# Patient Record
Sex: Female | Born: 1935 | Race: White | Hispanic: No | Smoking: Former smoker
Health system: Southern US, Community
[De-identification: ages and names within clinical notes are randomized; demographics above are authoritative.]

## PROBLEM LIST (undated history)

## (undated) DIAGNOSIS — R011 Cardiac murmur, unspecified: Secondary | ICD-10-CM

## (undated) DIAGNOSIS — G8929 Other chronic pain: Secondary | ICD-10-CM

## (undated) DIAGNOSIS — I259 Chronic ischemic heart disease, unspecified: Secondary | ICD-10-CM

## (undated) DIAGNOSIS — I209 Angina pectoris, unspecified: Secondary | ICD-10-CM

## (undated) DIAGNOSIS — M549 Dorsalgia, unspecified: Secondary | ICD-10-CM

## (undated) DIAGNOSIS — D509 Iron deficiency anemia, unspecified: Secondary | ICD-10-CM

## (undated) DIAGNOSIS — I10 Essential (primary) hypertension: Secondary | ICD-10-CM

## (undated) DIAGNOSIS — E039 Hypothyroidism, unspecified: Secondary | ICD-10-CM

## (undated) DIAGNOSIS — E785 Hyperlipidemia, unspecified: Secondary | ICD-10-CM

## (undated) HISTORY — DX: Hyperlipidemia, unspecified: E78.5

## (undated) HISTORY — DX: Chronic ischemic heart disease, unspecified: I25.9

## (undated) HISTORY — DX: Essential (primary) hypertension: I10

## (undated) HISTORY — DX: Cardiac murmur, unspecified: R01.1

## (undated) HISTORY — PX: APPENDECTOMY: SHX54

## (undated) HISTORY — DX: Dorsalgia, unspecified: M54.9

## (undated) HISTORY — DX: Angina pectoris, unspecified: I20.9

## (undated) HISTORY — DX: Hypothyroidism, unspecified: E03.9

## (undated) HISTORY — DX: Iron deficiency anemia, unspecified: D50.9

## (undated) HISTORY — DX: Other chronic pain: G89.29

---

## 1996-12-23 HISTORY — PX: CORONARY ANGIOPLASTY: SHX604

## 1997-12-04 ENCOUNTER — Other Ambulatory Visit: Admission: RE | Admit: 1997-12-04 | Discharge: 1997-12-04 | Payer: Self-pay | Admitting: Cardiology

## 1999-06-11 ENCOUNTER — Encounter: Admission: RE | Admit: 1999-06-11 | Discharge: 1999-09-09 | Payer: Self-pay | Admitting: Anesthesiology

## 1999-08-20 ENCOUNTER — Encounter: Payer: Self-pay | Admitting: Neurosurgery

## 1999-08-20 ENCOUNTER — Ambulatory Visit (HOSPITAL_COMMUNITY): Admission: RE | Admit: 1999-08-20 | Discharge: 1999-08-20 | Payer: Self-pay | Admitting: Neurosurgery

## 2002-03-17 ENCOUNTER — Encounter: Payer: Self-pay | Admitting: Emergency Medicine

## 2002-03-17 ENCOUNTER — Inpatient Hospital Stay (HOSPITAL_COMMUNITY): Admission: EM | Admit: 2002-03-17 | Discharge: 2002-03-21 | Payer: Self-pay | Admitting: Emergency Medicine

## 2002-03-21 ENCOUNTER — Inpatient Hospital Stay (HOSPITAL_COMMUNITY)
Admission: RE | Admit: 2002-03-21 | Discharge: 2002-03-29 | Payer: Self-pay | Admitting: Physical Medicine & Rehabilitation

## 2002-03-28 ENCOUNTER — Encounter: Payer: Self-pay | Admitting: Physical Medicine & Rehabilitation

## 2002-07-30 ENCOUNTER — Encounter: Payer: Self-pay | Admitting: Neurosurgery

## 2002-07-30 ENCOUNTER — Ambulatory Visit (HOSPITAL_COMMUNITY): Admission: RE | Admit: 2002-07-30 | Discharge: 2002-07-30 | Payer: Self-pay | Admitting: Neurosurgery

## 2002-09-01 ENCOUNTER — Encounter: Admission: RE | Admit: 2002-09-01 | Discharge: 2002-09-01 | Payer: Self-pay | Admitting: Neurosurgery

## 2002-09-01 ENCOUNTER — Encounter: Payer: Self-pay | Admitting: Neurosurgery

## 2002-09-15 ENCOUNTER — Encounter: Admission: RE | Admit: 2002-09-15 | Discharge: 2002-09-15 | Payer: Self-pay | Admitting: Neurosurgery

## 2002-09-15 ENCOUNTER — Encounter: Payer: Self-pay | Admitting: Neurosurgery

## 2002-09-30 ENCOUNTER — Encounter: Payer: Self-pay | Admitting: Neurosurgery

## 2002-09-30 ENCOUNTER — Encounter: Admission: RE | Admit: 2002-09-30 | Discharge: 2002-09-30 | Payer: Self-pay | Admitting: Neurosurgery

## 2004-03-15 ENCOUNTER — Encounter: Admission: RE | Admit: 2004-03-15 | Discharge: 2004-03-15 | Payer: Self-pay | Admitting: Orthopaedic Surgery

## 2004-04-01 ENCOUNTER — Encounter: Admission: RE | Admit: 2004-04-01 | Discharge: 2004-04-01 | Payer: Self-pay | Admitting: Orthopaedic Surgery

## 2004-04-19 ENCOUNTER — Encounter: Admission: RE | Admit: 2004-04-19 | Discharge: 2004-04-19 | Payer: Self-pay | Admitting: Orthopaedic Surgery

## 2005-08-29 ENCOUNTER — Encounter: Admission: RE | Admit: 2005-08-29 | Discharge: 2005-08-29 | Payer: Self-pay | Admitting: Cardiology

## 2006-08-21 HISTORY — PX: TRANSTHORACIC ECHOCARDIOGRAM: SHX275

## 2007-03-26 ENCOUNTER — Encounter: Admission: RE | Admit: 2007-03-26 | Discharge: 2007-03-26 | Payer: Self-pay | Admitting: Anesthesiology

## 2009-05-14 ENCOUNTER — Encounter: Admission: RE | Admit: 2009-05-14 | Discharge: 2009-05-14 | Payer: Self-pay | Admitting: Neurosurgery

## 2009-05-28 ENCOUNTER — Encounter: Admission: RE | Admit: 2009-05-28 | Discharge: 2009-05-28 | Payer: Self-pay | Admitting: Neurosurgery

## 2009-12-19 DEATH — deceased

## 2010-02-20 ENCOUNTER — Ambulatory Visit: Payer: Self-pay | Admitting: Cardiology

## 2010-04-08 ENCOUNTER — Encounter: Payer: Self-pay | Admitting: Internal Medicine

## 2010-04-11 ENCOUNTER — Ambulatory Visit: Payer: Self-pay | Admitting: Internal Medicine

## 2010-04-11 DIAGNOSIS — M5137 Other intervertebral disc degeneration, lumbosacral region: Secondary | ICD-10-CM

## 2010-04-11 DIAGNOSIS — M549 Dorsalgia, unspecified: Secondary | ICD-10-CM | POA: Insufficient documentation

## 2010-04-11 DIAGNOSIS — R5383 Other fatigue: Secondary | ICD-10-CM

## 2010-04-11 DIAGNOSIS — I2510 Atherosclerotic heart disease of native coronary artery without angina pectoris: Secondary | ICD-10-CM | POA: Insufficient documentation

## 2010-04-11 DIAGNOSIS — E119 Type 2 diabetes mellitus without complications: Secondary | ICD-10-CM

## 2010-04-11 DIAGNOSIS — Z853 Personal history of malignant neoplasm of breast: Secondary | ICD-10-CM

## 2010-04-11 DIAGNOSIS — M19019 Primary osteoarthritis, unspecified shoulder: Secondary | ICD-10-CM | POA: Insufficient documentation

## 2010-04-11 DIAGNOSIS — F411 Generalized anxiety disorder: Secondary | ICD-10-CM | POA: Insufficient documentation

## 2010-04-11 DIAGNOSIS — M48061 Spinal stenosis, lumbar region without neurogenic claudication: Secondary | ICD-10-CM | POA: Insufficient documentation

## 2010-04-11 DIAGNOSIS — F068 Other specified mental disorders due to known physiological condition: Secondary | ICD-10-CM

## 2010-04-11 DIAGNOSIS — D649 Anemia, unspecified: Secondary | ICD-10-CM

## 2010-04-11 DIAGNOSIS — R5381 Other malaise: Secondary | ICD-10-CM

## 2010-04-11 DIAGNOSIS — N959 Unspecified menopausal and perimenopausal disorder: Secondary | ICD-10-CM | POA: Insufficient documentation

## 2010-04-11 DIAGNOSIS — R413 Other amnesia: Secondary | ICD-10-CM

## 2010-04-11 DIAGNOSIS — I10 Essential (primary) hypertension: Secondary | ICD-10-CM | POA: Insufficient documentation

## 2010-04-11 DIAGNOSIS — F3289 Other specified depressive episodes: Secondary | ICD-10-CM | POA: Insufficient documentation

## 2010-04-11 DIAGNOSIS — E785 Hyperlipidemia, unspecified: Secondary | ICD-10-CM

## 2010-04-11 DIAGNOSIS — F329 Major depressive disorder, single episode, unspecified: Secondary | ICD-10-CM

## 2010-04-11 DIAGNOSIS — E559 Vitamin D deficiency, unspecified: Secondary | ICD-10-CM | POA: Insufficient documentation

## 2010-04-11 DIAGNOSIS — E039 Hypothyroidism, unspecified: Secondary | ICD-10-CM | POA: Insufficient documentation

## 2010-04-11 DIAGNOSIS — I252 Old myocardial infarction: Secondary | ICD-10-CM

## 2010-04-18 ENCOUNTER — Encounter: Admission: RE | Admit: 2010-04-18 | Discharge: 2010-04-18 | Payer: Self-pay | Admitting: Internal Medicine

## 2010-07-01 ENCOUNTER — Ambulatory Visit: Payer: Self-pay | Admitting: Cardiology

## 2010-07-01 ENCOUNTER — Encounter: Payer: Self-pay | Admitting: Internal Medicine

## 2010-07-10 ENCOUNTER — Ambulatory Visit: Payer: Self-pay | Admitting: Internal Medicine

## 2010-08-18 LAB — CONVERTED CEMR LAB
Albumin: 4.1 g/dL (ref 3.5–5.2)
Alkaline Phosphatase: 42 units/L (ref 39–117)
BUN: 27 mg/dL — ABNORMAL HIGH (ref 6–23)
Basophils Relative: 0.2 % (ref 0.0–3.0)
Bilirubin Urine: NEGATIVE
CO2: 27 meq/L (ref 19–32)
Chloride: 102 meq/L (ref 96–112)
Cholesterol: 159 mg/dL (ref 0–200)
Creatinine, Ser: 1.2 mg/dL (ref 0.4–1.2)
Eosinophils Relative: 1 % (ref 0.0–5.0)
Folate: >20 ng/mL
Glucose, Bld: 138 mg/dL — ABNORMAL HIGH (ref 70–99)
Hemoglobin, Urine: NEGATIVE
Hemoglobin: 10.5 g/dL — ABNORMAL LOW (ref 12.0–15.0)
Iron: 107 ug/dL (ref 42–145)
Ketones, ur: NEGATIVE mg/dL
Lymphocytes Relative: 22.2 % (ref 12.0–46.0)
MCV: 94.1 fL (ref 78.0–100.0)
Microalb Creat Ratio: 1 mg/g (ref 0.0–30.0)
Microalb, Ur: 0.6 mg/dL (ref 0.0–1.9)
Neutro Abs: 5 10*3/uL (ref 1.4–7.7)
Neutrophils Relative %: 70.7 % (ref 43.0–77.0)
Platelets: 105 10*3/uL — ABNORMAL LOW (ref 150.0–400.0)
RBC: 3.27 M/uL — ABNORMAL LOW (ref 3.87–5.11)
Sed Rate: 17 mm/hr (ref 0–22)
Total Protein, Urine: NEGATIVE mg/dL
Transferrin: 240.3 mg/dL (ref 212.0–360.0)
Urine Glucose: NEGATIVE mg/dL
Urobilinogen, UA: 0.2 (ref 0.0–1.0)
Vit D, 25-Hydroxy: 46 ng/mL (ref 30–89)

## 2010-08-22 ENCOUNTER — Telehealth: Payer: Self-pay | Admitting: Internal Medicine

## 2010-08-22 NOTE — Letter (Signed)
Summary: Date Range: 12-05-96 to 04-08-10/[facility]  Date Range: 12-05-96 to 04-08-10/[facility]   Imported By: Sherian Rein 04/15/2010 08:14:56  _____________________________________________________________________  External Attachment:    Type:   Image     Comment:   External Document

## 2010-08-22 NOTE — Miscellaneous (Signed)
Summary: BONE DENSITY  Clinical Lists Changes  Orders: Added new Test order of T-Lumbar Vertebral Assessment (77082) - Signed 

## 2010-08-22 NOTE — Assessment & Plan Note (Signed)
Summary: NEW / MEDICARE / #/ CD   Vital Signs:  Patient profile:   77 year old female Height:      65 inches Weight:      152.38 pounds BMI:     25.45 O2 Sat:      97 % on Room air Temp:     98.4 degrees F oral Pulse rate:   55 / minute BP sitting:   120 / 62  (left arm) Cuff size:   regular  Vitals Entered By: Zella Ball Ewing CMA Duncan Dull) (April 11, 2010 10:31 AM)  O2 Flow:  Room air  Preventive Care Screening     declines colonoscopy  CC: new Patient, New medicare/RE/wellness   CC:  new Patient and New medicare/RE/wellness.  History of Present Illness: here as new pt to establish with wellness and acute;  hx somewhat difficult and she states she recognizes she has had singificant worsening problem with ST memory gradually for the past yr, though she feels she can take her meds approp and understands what they are for and how to take, though not strengths.  A nephew lives with her, and she c/o being taken advantage of when he brings friends over she does not approve above, no apparent abuse, though she c/o significant stress and depressive symptoms related to this in the past few months (though she delcines meds or counseling due to her mult current meds);  denies suicidal ideation, or panic. Has ongoing fatigue but denies hypersmonia. Had incidnental myalgias and watery diarrea for 2 days earlier this wk now resolved without fever, ST, cough, dysuria and Pt denies CP, worsening sob, doe, wheezing, orthopnea, pnd, worsening LE edema, palps, dizziness or syncope  Pt denies new neuro symptoms such as headache, facial or extremity weakness  No fever, wt loss, night sweats, loss of appetite or other constitutional symptoms  Pt denies polydipsia, polyuria, or low sugar symptoms such as shakiness improved with eating.  Overall good compliance with meds, trying to follow low chol, DM diet, wt stable, little excercise however  Denies worsening hyper or hypothyroid symptoms, did have synthorid  adjusted early aug per cardiology, now due for f/u TSH.   Here for wellness Diet: Heart Healthy or DM if diabetic Physical Activities: Sedentary Depression/mood screen: yes with mult social stressors  Hearing: Intact bilateral Visual Acuity: Grossly normal, gets exam yearly ADL's: Capable , has rolling walker  Fall Risk: mild to moderate Home Safety: Good Cognitive Impairment:  Gen appearance, speech, attention & motor skills grossly intact except for depressed affect and mild cognitive dysfunction End-of-Life Planning: Advance directive - Full code/I agree   Preventive Screening-Counseling & Management  Alcohol-Tobacco     Smoking Status: quit      Drug Use:  no.    Problems Prior to Update: 1)  Unspecified Vitamin D Deficiency  (ICD-268.9) 2)  Menopausal Disorder  (ICD-627.9) 3)  Memory Loss  (ICD-780.93) 4)  Fatigue  (ICD-780.79) 5)  Depression  (ICD-311) 6)  Dementia  (ICD-294.8) 7)  Hypothyroidism  (ICD-244.9) 8)  Back Pain, Chronic  (ICD-724.5) 9)  Anxiety  (ICD-300.00) 10)  Osteoarthritis, Shoulder, Right  (ICD-715.91) 11)  Spinal Stenosis, Lumbar  (ICD-724.02) 12)  Disc Disease, Lumbar  (ICD-722.52) 13)  Breast Cancer, Hx of  (ICD-V10.3) 14)  Hyperlipidemia  (ICD-272.4) 15)  Anemia-nos  (ICD-285.9) 16)  Myocardial Infarction, Hx of  (ICD-412) 17)  Coronary Artery Disease  (ICD-414.00) 18)  Hypertension  (ICD-401.9) 19)  Diabetes Mellitus, Type II  (ICD-250.00)  Medications  Prior to Update: 1)  None  Current Medications (verified): 1)  Isosorbide Mononitrate Cr 120 Mg Xr24h-Tab (Isosorbide Mononitrate) .Marland Kitchen.. 1po Once Daily 2)  Metformin Hcl 500 Mg Tabs (Metformin Hcl) .... 2 By Mouth Once Daily 3)  Nitrostat 0.3 Mg Subl (Nitroglycerin) .... Use Asd 4)  Lipitor 80 Mg Tabs (Atorvastatin Calcium) .Marland Kitchen.. 1po Once Daily 5)  Toprol Xl 100 Mg Xr24h-Tab (Metoprolol Succinate) .Marland Kitchen.. 1 By Mouth Once Daily 6)  Avapro 300 Mg Tabs (Irbesartan) .Marland Kitchen.. 1po Once Daily 7)  Ecotrin  325 Mg Tbec (Aspirin) .Marland Kitchen.. 1po Once Daily 8)  Alprazolam 1 Mg Tabs (Alprazolam) .Marland Kitchen.. 1po At Bedtime 9)  Oxycontin 40 Mg Xr12h-Tab (Oxycodone Hcl) .Marland Kitchen.. 1 By Mouth Two Times A Day  - Dr Channing Mutters 10)  Iron 325 (65 Fe) Mg Tabs (Ferrous Sulfate) .Marland Kitchen.. 1po Once Daily 11)  Furosemide 40 Mg Tabs (Furosemide) .Marland Kitchen.. 1po Once Daily 12)  Levothyroxine Sodium 50 Mcg Tabs (Levothyroxine Sodium) .Marland Kitchen.. 1po Once Daily 13)  Aleve 220 Mg Tabs (Naproxen Sodium) .Marland Kitchen.. 1po Two Times A Day As Needed 14)  Glimepiride 4 Mg Tabs (Glimepiride) .Marland Kitchen.. 1 By Mouth Once Daily 15)  Mycolog Cream .... Asd As Needed 16)  Mvi .Marland Kitchen.. 1 By Mouth Once Daily 17)  Folic Acid 1 Mg Tabs (Folic Acid) .Marland Kitchen.. 1po Once Daily 18)  Metoprolol Tartrate 50 Mg Tabs (Metoprolol Tartrate) .Marland Kitchen.. 1 By Mouth Two Times A Day 19)  Carisoprodol 350 Mg Tabs (Carisoprodol) 20)  Oxycodone-Acetaminophen 10-325 Mg Tabs (Oxycodone-Acetaminophen) .Marland Kitchen.. 1 By Mouth Q 6 Hrs As Needed - Dr Channing Mutters 21)  Onetouch Ultra Blue  Strp (Glucose Blood) .... 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 (pt unclear on details) - occurred early 1980's right shoulder djd Anxiety chronic pain syndrome Hypothyroidism Dementia Depression  Past Surgical History: s/p right hip fx - 2003 - Dr Cleophas Dunker Appendectomy s/p back surgury s/p right breast mastectomy  Family History: Reviewed history and no changes required. vascular disease  father died with MI nephew x 2 - heart disease brother with cancer  mother died at childbirth sister died with cancer  Social History: Reviewed history and no changes required. 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  The patient denies anorexia, fever, vision loss, decreased hearing, hoarseness, chest pain, syncope, dyspnea on exertion, peripheral edema, prolonged cough, headaches, hemoptysis, abdominal pain, melena, hematochezia, severe indigestion/heartburn, hematuria, muscle weakness, suspicious skin lesions, transient blindness, difficulty walking, unusual weight change, abnormal bleeding, enlarged lymph nodes, and angioedema.         all otherwise negative per pt -  except for ongoing fatigue without hypersomnia  Physical Exam  General:  alert and underweight appearing.  , uncomfortable with her chronic pain Head:  normocephalic and atraumatic.   Eyes:  vision grossly intact, pupils equal, and pupils round.   Ears:  R ear normal and L ear normal.   Nose:  no external deformity and no nasal discharge.   Mouth:  no gingival abnormalities and pharynx pink and moist.   Neck:  supple and no masses.   Lungs:  normal respiratory effort and normal breath sounds.   Heart:  normal rate and regular rhythm.   Abdomen:  soft, non-tender, and normal bowel sounds.   Msk:  no  peripheral joint tenderness and no joint swelling.   Extremities:  no edema, no erythema  Neurologic:  cranial nerves II-XII intact and strength normal in all extremities., walks with walker; cognitive intact to orientation, naming, and repetition except mild problem with recall Skin:  color normal and no rashes.   Psych:  dysphoric affect and moderately anxious.     Impression & Recommendations:  Problem # 1:  Preventive Health Care (ICD-V70.0)  Overall doing well, age appropriate education and counseling updated and referral for appropriate preventive services done unless declined, immunizations up to date or declined, diet counseling done if overweight, urged to quit smoking if smokes , most recent labs reviewed and current ordered if appropriate, ecg reviewed or declined (interpretation per ECG scanned in the EMR if done); information regarding Medicare Prevention requirements given if appropriate; speciality referrals updated as appropriate ; had ECG with cardiology recently  Orders: Medicare -1st Annual Wellness Visit [phone])  Problem # 2:  DEMENTIA (ICD-294.8) new onset in the past yr;  for head mri, routine labs and neurology referral  Problem # 3:  DIABETES MELLITUS, TYPE II (ICD-250.00)  Her updated medication list for this problem includes:    Metformin Hcl 500 Mg Tabs (Metformin hcl) .Marland Kitchen... 2 by mouth once daily    Avapro 300 Mg Tabs (Irbesartan) .Marland Kitchen... 1po once daily    Ecotrin 325 Mg Tbec (Aspirin) .Marland Kitchen... 1po once daily    Glimepiride 4 Mg Tabs (Glimepiride) .Marland Kitchen... 1 by mouth once daily  Orders: TLB-Microalbumin/Creat Ratio, Urine (82043-MALB) TLB-A1C / Hgb A1C (Glycohemoglobin) (83036-A1C)  Problem # 4:  HYPERTENSION (ICD-401.9)  Her updated medication list for this problem includes:    Toprol Xl 100 Mg Xr24h-tab (Metoprolol succinate) .Marland Kitchen... 1 by mouth once daily    Avapro 300 Mg Tabs (Irbesartan) .Marland Kitchen... 1po once daily    Furosemide 40 Mg Tabs (Furosemide) .Marland Kitchen... 1po once  daily    Metoprolol Tartrate 50 Mg Tabs (Metoprolol tartrate) .Marland Kitchen... 1 by mouth two times a day  Orders: TLB-Udip ONLY (81003-UDIP)  Problem # 5:  HYPOTHYROIDISM (ICD-244.9)  Her updated medication list for this problem includes:    Levothyroxine Sodium 50 Mcg Tabs (Levothyroxine sodium) .Marland Kitchen... 1po once daily stable overall by hx and exam, ok to  continue meds/tx as is ;  to check TSH on new thyroid med strength  Problem # 6:  HYPERLIPIDEMIA (ICD-272.4)  Her updated medication list for this problem includes:    Lipitor 80 Mg Tabs (Atorvastatin calcium) .Marland Kitchen... 1po once daily  Orders: TLB-Lipid Panel (80061-LIPID) stable overall by hx and exam, ok to continue meds/tx as is , Pt to continue diet efforts, good med tolerance; to check labs - goal LDL less than 70   Problem # 7:  ANEMIA-NOS (ICD-285.9)  Her updated medication list for this problem includes:    Iron 325 (65 Fe) Mg Tabs (Ferrous sulfate) .Marland Kitchen... 1po once daily    Folic Acid 1 Mg Tabs (Folic acid) .Marland Kitchen... 1po once daily  Orders: TLB-IBC Pnl (Iron/FE;Transferrin) (83550-IBC) TLB-B12 + Folate Pnl (82746_82607-B12/FOL) to re-check hgb and above, o/w stable overall by hx and exam, ok to continue meds/tx as is   Problem # 8:  DEPRESSION (ICD-311)  Her updated medication list for this problem includes:    Alprazolam 1 Mg Tabs (Alprazolam) .Marland Kitchen... 1po at bedtime pt delcines tx at this time or counseling  Problem # 9:  FATIGUE (ICD-780.79)  exam benign except for mult co-morbids today, to check labs below; follow with expectant management   Orders: TLB-BMP (Basic Metabolic Panel-BMET) (80048-METABOL) TLB-CBC Platelet - w/Differential (85025-CBCD) TLB-Hepatic/Liver Function Pnl (80076-HEPATIC) TLB-TSH (Thyroid Stimulating Hormone) (84443-TSH) TLB-Sedimentation Rate (ESR) (85652-ESR)  Complete Medication List: 1)  Isosorbide Mononitrate Cr 120 Mg Xr24h-tab (Isosorbide mononitrate) .Marland Kitchen.. 1po once daily 2)  Metformin Hcl 500 Mg  Tabs (Metformin hcl) .... 2 by mouth once daily 3)  Nitrostat 0.3 Mg Subl (Nitroglycerin) .... Use asd 4)  Lipitor 80 Mg Tabs (Atorvastatin calcium) .Marland Kitchen.. 1po once daily 5)  Toprol Xl 100 Mg Xr24h-tab (Metoprolol succinate) .Marland Kitchen.. 1 by mouth once daily 6)  Avapro 300 Mg Tabs (Irbesartan) .Marland Kitchen.. 1po once daily 7)  Ecotrin 325 Mg Tbec (Aspirin) .Marland Kitchen.. 1po once daily 8)  Alprazolam 1 Mg Tabs (Alprazolam) .Marland Kitchen.. 1po at bedtime 9)  Oxycontin 40 Mg Xr12h-tab (Oxycodone hcl) .Marland Kitchen.. 1 by mouth two times a day  - dr Channing Mutters 10)  Iron 325 (65 Fe) Mg Tabs (Ferrous sulfate) .Marland Kitchen.. 1po once daily 11)  Furosemide 40 Mg Tabs (Furosemide) .Marland Kitchen.. 1po once daily 12)  Levothyroxine Sodium 50 Mcg Tabs (Levothyroxine sodium) .Marland Kitchen.. 1po once daily 13)  Aleve 220 Mg Tabs (Naproxen sodium) .Marland Kitchen.. 1po two times a day as needed 14)  Glimepiride 4 Mg Tabs (Glimepiride) .Marland Kitchen.. 1 by mouth once daily 15)  Mycolog Cream  .... Asd as needed 16)  Mvi  .Marland Kitchen.. 1 by mouth once daily 17)  Folic Acid 1 Mg Tabs (Folic acid) .Marland Kitchen.. 1po once daily 18)  Metoprolol Tartrate 50 Mg Tabs (Metoprolol tartrate) .Marland Kitchen.. 1 by mouth two times a day 19)  Carisoprodol 350 Mg Tabs (Carisoprodol) 20)  Oxycodone-acetaminophen 10-325 Mg Tabs (Oxycodone-acetaminophen) .Marland Kitchen.. 1 by mouth q 6 hrs as needed - dr Channing Mutters 21)  Onetouch Ultra Blue Strp (Glucose blood) .... Use asd 1 once daily  Other Orders: T-Syphilis Test (RPR) [phone]) T-Bone Densitometry [phone]) T-Vitamin D (25-Hydroxy) [phone]) Radiology Referral (Radiology) Neurology Referral (Neuro) Flu Vaccine 4yrs + MEDICARE PATIENTS [phone]) Administration Flu vaccine - MCR (G0008) Tdap => 41yrs IM (38756) Pneumococcal Vaccine (43329) Admin 1st Vaccine (51884) Admin of Any Addtl Vaccine (16606)  Patient Instructions: 1)  please see Dr Patty Sermons in followup in Dec 2011 as you have planned 2)  you had the tetanus, flu, and pneumonia shots today 3)  please call for your yearly mammogram - consider  [facility]  on [address] 4)  Please schedule your Bone Density test before leaving today 5)  Please go to the Lab in the basement for your blood and/or urine tests today 6)  Please call the number on the [facility] Card for results of your testing  7)  You will be contacted about the referral(s) to: MRI head (for the memory loss) and Neurology consult 8)  Continue all previous medications as before this visit  - there are no  medication changes today 9)  Please schedule a follow-up appointment in 3 months.    Flu Vaccine Consent Questions     Do you have a history of severe allergic reactions to this vaccine? no    Any prior history of allergic reactions to egg and/or gelatin? no    Do you have a sensitivity to the preservative Thimersol? no    Do you have a past history of Guillan-Barre Syndrome? no    Do you currently have an acute febrile illness? no    Have you ever had a severe reaction to latex? no    Vaccine information given and explained to patient? yes    Are you currently pregnant? no    Lot Number:[id number]   Exp Date:01/18/2011   Site Given  Left Deltoid IMlu   Immunizations Administered:  Tetanus Vaccine:    Vaccine Type: Tdap    Site: right deltoid    Mfr: GlaxoSmithKline    Dose: 0.5 ml    Route: IM    Given by: Zella Ball Ewing CMA (AAMA)    Exp. Date: 05/09/2012    Lot #: [id number]    VIS given: 06/07/08 version given April 11, 2010.  Pneumonia Vaccine:    Vaccine Type: Pneumovax    Site: left buttock    Mfr: Merck    Dose: 0.5 ml    Route: IM    Given by: Zella Ball Ewing CMA (AAMA)    Exp. Date: 10/03/2011    Lot #: [id number]    VIS given: 06/25/09 version given April 11, 2010.

## 2010-08-27 ENCOUNTER — Ambulatory Visit (INDEPENDENT_AMBULATORY_CARE_PROVIDER_SITE_OTHER): Payer: Medicare Other | Admitting: Internal Medicine

## 2010-08-27 ENCOUNTER — Encounter: Payer: Self-pay | Admitting: Internal Medicine

## 2010-08-27 ENCOUNTER — Encounter (INDEPENDENT_AMBULATORY_CARE_PROVIDER_SITE_OTHER): Payer: Self-pay | Admitting: *Deleted

## 2010-08-27 ENCOUNTER — Other Ambulatory Visit: Payer: Self-pay | Admitting: Internal Medicine

## 2010-08-27 ENCOUNTER — Other Ambulatory Visit: Payer: Medicare Other

## 2010-08-27 DIAGNOSIS — E119 Type 2 diabetes mellitus without complications: Secondary | ICD-10-CM

## 2010-08-27 DIAGNOSIS — E785 Hyperlipidemia, unspecified: Secondary | ICD-10-CM

## 2010-08-27 DIAGNOSIS — M810 Age-related osteoporosis without current pathological fracture: Secondary | ICD-10-CM

## 2010-08-27 DIAGNOSIS — I10 Essential (primary) hypertension: Secondary | ICD-10-CM

## 2010-08-27 LAB — LIPID PANEL
HDL: 35.1 mg/dL — ABNORMAL LOW (ref >39.00)
Total CHOL/HDL Ratio: 4
Triglycerides: 269 mg/dL — ABNORMAL HIGH (ref 0.0–149.0)

## 2010-08-27 LAB — HEMOGLOBIN A1C: Hgb A1c MFr Bld: 7.1 % — ABNORMAL HIGH (ref 4.6–6.5)

## 2010-08-27 LAB — BASIC METABOLIC PANEL
Calcium: 8.8 mg/dL (ref 8.4–10.5)
GFR: 34.77 mL/min — ABNORMAL LOW (ref >60.00)
Sodium: 140 mEq/L (ref 135–145)

## 2010-08-28 NOTE — Progress Notes (Signed)
Summary: Rx ?  Phone Note Call from Patient   Summary of Call: Pt left vm req non-narcotic rx? Req call back.  Initial call taken by: Lamar Sprinkles, CMA,  August 22, 2010 11:24 AM  Follow-up for Phone Call        Pt called requesting refill of Oxycodone, Oxycontin and Levothyroxine. Pt advised to make ROV to refill narc Follow-up by: Margaret Pyle, CMA,  August 22, 2010 4:25 PM    Prescriptions: LEVOTHYROXINE SODIUM 50 MCG TABS (LEVOTHYROXINE SODIUM) 1po once daily  #90 x 1   Entered by:   Margaret Pyle, CMA   Authorized by:   Corwin Levins MD   Signed by:   Margaret Pyle, CMA on 08/22/2010   Method used:   Electronically to        [facility] [phone]* (retail)       [address]       [place], [place]  [address]       Ph: [phone] or [phone]       Fax: [phone]   RxID:   [id number]

## 2010-08-29 ENCOUNTER — Telehealth: Payer: Self-pay | Admitting: Internal Medicine

## 2010-09-05 NOTE — Assessment & Plan Note (Signed)
Summary: FU ON MEDS /NWS   Vital Signs:  Patient profile:   77 year old female Height:      63 inches Weight:      154.25 pounds BMI:     27.42 O2 Sat:      96 % on Room air Temp:     98.3 degrees F oral Pulse rate:   63 / minute BP sitting:   118 / 50  (left arm) Cuff size:   regular  Vitals Entered By: Zella Ball Ewing CMA (AAMA) (August 27, 2010 3:15 PM)  O2 Flow:  Room air CC: followup/RE   CC:  followup/RE.  History of Present Illness: here to f/fu - overall doing ok,  Pt denies CP, worsening sob, doe, wheezing, orthopnea, pnd, worsening LE edema, palps, dizziness or syncope  Pt denies new neuro symptoms such as headache, facial or extremity weakness Pt denies polydipsia, polyuria, or low sugar symptoms such as shakiness improved with eating.  Overall good compliance with meds, trying to follow low chol, DM diet, wt stable, little excercise however  Not really checking cbg's at home. Denies worsening depressive symptoms, suicidal ideation, or panic, though has ongoing anxiety and needs med refills.  Overall pain continues but controlled on current meds, needs refill.  Overall good compliance with meds, and good tolerability.  No fever, wt loss, night sweats, loss of appetite or other constitutional symptoms  Could not tolerate the alendronate  - seemed to make her chronic pain worse;  not gettin the oxycontin per DR Channing Mutters anymore as he has moved 45 min out of town and she cant go that far;  same for xanax.    Problems Prior to Update: 1)  Osteoporosis  (ICD-733.00) 2)  Preventive Health Care  (ICD-V70.0) 3)  Unspecified Vitamin D Deficiency  (ICD-268.9) 4)  Menopausal Disorder  (ICD-627.9) 5)  Memory Loss  (ICD-780.93) 6)  Fatigue  (ICD-780.79) 7)  Depression  (ICD-311) 8)  Dementia  (ICD-294.8) 9)  Hypothyroidism  (ICD-244.9) 10)  Back Pain, Chronic  (ICD-724.5) 11)  Anxiety  (ICD-300.00) 12)  Osteoarthritis, Shoulder, Right  (ICD-715.91) 13)  Spinal Stenosis, Lumbar   (ICD-724.02) 14)  Disc Disease, Lumbar  (ICD-722.52) 15)  Breast Cancer, Hx of  (ICD-V10.3) 16)  Hyperlipidemia  (ICD-272.4) 17)  Anemia-nos  (ICD-285.9) 18)  Myocardial Infarction, Hx of  (ICD-412) 19)  Coronary Artery Disease  (ICD-414.00) 20)  Hypertension  (ICD-401.9) 21)  Diabetes Mellitus, Type II  (ICD-250.00)  Medications Prior to Update: 1)  Isosorbide Mononitrate Cr 120 Mg Xr24h-Tab (Isosorbide Mononitrate) .Marland Kitchen.. 1po Once Daily 2)  Metformin Hcl 500 Mg Tabs (Metformin Hcl) .... 2 By Mouth in The Am, and 1 By Mouth in The Pm 3)  Nitrostat 0.3 Mg Subl (Nitroglycerin) .... Use Asd 4)  Lipitor 80 Mg Tabs (Atorvastatin Calcium) .Marland Kitchen.. 1po Once Daily 5)  Toprol Xl 100 Mg Xr24h-Tab (Metoprolol Succinate) .Marland Kitchen.. 1 By Mouth Once Daily 6)  Avapro 300 Mg Tabs (Irbesartan) .Marland Kitchen.. 1po Once Daily 7)  Ecotrin 325 Mg Tbec (Aspirin) .Marland Kitchen.. 1po Once Daily 8)  Alprazolam 1 Mg Tabs (Alprazolam) .Marland Kitchen.. 1po At Bedtime 9)  Oxycontin 40 Mg Xr12h-Tab (Oxycodone Hcl) .Marland Kitchen.. 1 By Mouth Two Times A Day  - Dr Channing Mutters 10)  Iron 325 (65 Fe) Mg Tabs (Ferrous Sulfate) .Marland Kitchen.. 1po Once Daily 11)  Furosemide 40 Mg Tabs (Furosemide) .Marland Kitchen.. 1po Once Daily 12)  Levothyroxine Sodium 50 Mcg Tabs (Levothyroxine Sodium) .Marland Kitchen.. 1po Once Daily 13)  Aleve 220 Mg Tabs (Naproxen  Sodium) .Marland Kitchen.. 1po Two Times A Day As Needed 14)  Glimepiride 4 Mg Tabs (Glimepiride) .Marland Kitchen.. 1 By Mouth Once Daily 15)  Mycolog Cream .... Asd As Needed 16)  Mvi .Marland Kitchen.. 1 By Mouth Once Daily 17)  Folic Acid 1 Mg Tabs (Folic Acid) .Marland Kitchen.. 1po Once Daily 18)  Metoprolol Tartrate 50 Mg Tabs (Metoprolol Tartrate) .Marland Kitchen.. 1 By Mouth Two Times A Day 19)  Carisoprodol 350 Mg Tabs (Carisoprodol) 20)  Oxycodone-Acetaminophen 10-325 Mg Tabs (Oxycodone-Acetaminophen) .Marland Kitchen.. 1 By Mouth Q 6 Hrs As Needed - Dr Channing Mutters 21)  Onetouch Ultra Blue  Strp (Glucose Blood) .... Use Asd 1 Once Daily 22)  Alendronate Sodium 70 Mg Tabs (Alendronate Sodium) .Marland Kitchen.. 1po Q Wk  Current Medications (verified): 1)   Isosorbide Mononitrate Cr 120 Mg Xr24h-Tab (Isosorbide Mononitrate) .Marland Kitchen.. 1po Once Daily 2)  Metformin Hcl 500 Mg Tabs (Metformin Hcl) .... 2 By Mouth in The Am, and 1 By Mouth in The Pm 3)  Nitrostat 0.3 Mg Subl (Nitroglycerin) .... Use Asd 4)  Lipitor 80 Mg Tabs (Atorvastatin Calcium) .Marland Kitchen.. 1po Once Daily 5)  Toprol Xl 100 Mg Xr24h-Tab (Metoprolol Succinate) .Marland Kitchen.. 1 By Mouth Once Daily 6)  Avapro 300 Mg Tabs (Irbesartan) .Marland Kitchen.. 1po Once Daily 7)  Ecotrin 325 Mg Tbec (Aspirin) .Marland Kitchen.. 1po Once Daily 8)  Alprazolam 1 Mg Tabs (Alprazolam) .Marland Kitchen.. 1po At Bedtime - To Fill Aug 27, 2010 9)  Oxycontin 40 Mg Xr12h-Tab (Oxycodone Hcl) .Marland Kitchen.. 1 By Mouth Two Times A Day  - To Fill Sep 23, 2010 10)  Iron 325 (65 Fe) Mg Tabs (Ferrous Sulfate) .Marland Kitchen.. 1po Once Daily 11)  Furosemide 40 Mg Tabs (Furosemide) .Marland Kitchen.. 1po Once Daily 12)  Levothyroxine Sodium 50 Mcg Tabs (Levothyroxine Sodium) .Marland Kitchen.. 1po Once Daily 13)  Aleve 220 Mg Tabs (Naproxen Sodium) .Marland Kitchen.. 1po Two Times A Day As Needed 14)  Glimepiride 4 Mg Tabs (Glimepiride) .Marland Kitchen.. 1 By Mouth Once Daily 15)  Mycolog Cream .... Asd As Needed 16)  Mvi .Marland Kitchen.. 1 By Mouth Once Daily 17)  Folic Acid 1 Mg Tabs (Folic Acid) .Marland Kitchen.. 1po Once Daily 18)  Metoprolol Tartrate 50 Mg Tabs (Metoprolol Tartrate) .Marland Kitchen.. 1 By Mouth Two Times A Day 19)  Carisoprodol 350 Mg Tabs (Carisoprodol) 20)  Oxycodone-Acetaminophen 10-325 Mg Tabs (Oxycodone-Acetaminophen) .Marland Kitchen.. 1 By Mouth Q 6 Hrs As Needed - To Fill Aug 27, 2010 21)  Onetouch Ultra Blue  Strp (Glucose Blood) .... 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[employer] widow 1 child  - son died  - cancer, 21's 1 grandson - lives with her, also with  sister and brother in [place]  Risk Factors: Smoking Status: quit (04/11/2010)  Past Medical History: MD roster:  card - Dr Patty Sermons                   ortho - Dr Cleophas Dunker                     neurosurgury - Dr Channing Mutters                    optho - Dr Dione Booze Diabetes mellitus, type II Hypertension Coronary artery disease Myocardial infarction, hx of Anemia-NOS with  transfusion 2u PRBC with hip surgury 2003 Hyperlipidemia DDD/lumbar disc disease - severe with spinal stenosis Breast cancer, hx of - ? (pt unclear on details) - occurred early 1980's right shoulder djd Anxiety chronic pain syndrome Hypothyroidism Dementia Depression Osteoporosis  Review of Systems       all otherwise negative per pt -    Physical Exam  General:  alert and underweight appearing.  , uncomfortable with her chronic pain Head:  normocephalic and atraumatic.   Eyes:  vision grossly intact, pupils equal, and pupils round.   Ears:  R ear normal and L ear normal.   Nose:  no external deformity and no nasal discharge.   Mouth:  no gingival abnormalities and pharynx pink and moist.   Neck:  supple and no masses.   Lungs:  normal respiratory effort and normal breath sounds.   Heart:  normal rate and regular rhythm.   Msk:  no peripheral joint tenderness and no joint swelling.   Extremities:  no edema, no erythema  Neurologic:  cranial nerves II-XII intact and strength normal in all extremities., walks with walker;   Impression & Recommendations:  Problem # 1:  DIABETES MELLITUS, TYPE II (ICD-250.00)  Her updated medication list for this problem includes:    Metformin Hcl 500 Mg Tabs (Metformin hcl) .Marland Kitchen... 2 by mouth in the am, and 1 by mouth in the pm    Avapro 300 Mg Tabs (Irbesartan) .Marland Kitchen... 1po once daily    Ecotrin 325 Mg Tbec (Aspirin) .Marland Kitchen... 1po once daily    Glimepiride 4 Mg Tabs (Glimepiride) .Marland Kitchen... 1 by mouth once daily  Labs Reviewed: Creat: 1.2 (04/11/2010)    Reviewed HgBA1c results: 7.7  (04/11/2010) stable overall by hx and exam, ok to continue meds/tx as is , Pt to cont DM diet, excercise, wt control efforts; to check labs today   Orders: TLB-BMP (Basic Metabolic Panel-BMET) (80048-METABOL) TLB-Lipid Panel (80061-LIPID) TLB-A1C / Hgb A1C (Glycohemoglobin) (83036-A1C)  Problem # 2:  HYPERTENSION (ICD-401.9)  Her updated medication list for this problem includes:    Toprol Xl 100 Mg Xr24h-tab (Metoprolol succinate) .Marland Kitchen... 1 by mouth once daily    Avapro 300 Mg Tabs (Irbesartan) .Marland Kitchen... 1po once daily    Furosemide 40 Mg Tabs (Furosemide) .Marland Kitchen... 1po once daily    Metoprolol Tartrate 50 Mg Tabs (Metoprolol tartrate) .Marland Kitchen... 1 by mouth two times a day  BP today: 118/50 Prior BP: 120/62 (04/11/2010)  Labs Reviewed: K+: 4.8 (04/11/2010) Creat: : 1.2 (04/11/2010)   Chol: 159 (04/11/2010)   HDL: 48.00 (04/11/2010)   LDL: 82 (04/11/2010)   TG: 147.0 (04/11/2010) stable overall by hx and exam, ok to continue meds/tx as is   Problem # 3:  HYPERLIPIDEMIA (ICD-272.4)  Her updated medication list for this problem includes:    Lipitor 80 Mg Tabs (Atorvastatin calcium) .Marland Kitchen... 1po once daily  Labs Reviewed: SGOT: 29 (04/11/2010)   SGPT: 20 (04/11/2010)   HDL:48.00 (04/11/2010)  LDL:82 (04/11/2010)  Chol:159 (04/11/2010)  Trig:147.0 (04/11/2010) stable overall by hx and exam, ok to continue meds/tx as is  - Pt to continue diet efforts, good med tolerance; to check labs - goal LDL less than 70   Problem # 4:  OSTEOPOROSIS (ICD-733.00)  The following medications were removed from the medication list:    Alendronate Sodium 70 Mg Tabs (Alendronate sodium) .Marland Kitchen... 1po q wk could not tolerate  - ok to stop, will look into prolia for her as she has hx of hip fx  Discussed medication  use, applications of heat or ice, and exercises.   Problem # 5:  ANXIETY (ICD-300.00)  Her updated medication list for this problem includes:    Alprazolam 1 Mg Tabs (Alprazolam) .Marland Kitchen... 1po at bedtime - to  fill Aug 27, 2010 treat as above, f/u any worsening signs or symptoms   Problem # 6:  BACK PAIN, CHRONIC (ICD-724.5)  Her updated medication list for this problem includes:    Ecotrin 325 Mg Tbec (Aspirin) .Marland Kitchen... 1po once daily    Oxycontin 40 Mg Xr12h-tab (Oxycodone hcl) .Marland Kitchen... 1 by mouth two times a day  - to fill Sep 23, 2010    Aleve 220 Mg Tabs (Naproxen sodium) .Marland Kitchen... 1po two times a day as needed    Carisoprodol 350 Mg Tabs (Carisoprodol)    Oxycodone-acetaminophen 10-325 Mg Tabs (Oxycodone-acetaminophen) .Marland Kitchen... 1 by mouth q 6 hrs as needed - to fill Aug 27, 2010 pt no longer seeing Dr Channing Mutters - ok to refill meds - treat as above, f/u any worsening signs or symptoms , stable overall by hx and exam, ok to continue meds/tx as is   Complete Medication List: 1)  Isosorbide Mononitrate Cr 120 Mg Xr24h-tab (Isosorbide mononitrate) .Marland Kitchen.. 1po once daily 2)  Metformin Hcl 500 Mg Tabs (Metformin hcl) .... 2 by mouth in the am, and 1 by mouth in the pm 3)  Nitrostat 0.3 Mg Subl (Nitroglycerin) .... Use asd 4)  Lipitor 80 Mg Tabs (Atorvastatin calcium) .Marland Kitchen.. 1po once daily 5)  Toprol Xl 100 Mg Xr24h-tab (Metoprolol succinate) .Marland Kitchen.. 1 by mouth once daily 6)  Avapro 300 Mg Tabs (Irbesartan) .Marland Kitchen.. 1po once daily 7)  Ecotrin 325 Mg Tbec (Aspirin) .Marland Kitchen.. 1po once daily 8)  Alprazolam 1 Mg Tabs (Alprazolam) .Marland Kitchen.. 1po at bedtime - to fill Aug 27, 2010 9)  Oxycontin 40 Mg Xr12h-tab (Oxycodone hcl) .Marland Kitchen.. 1 by mouth two times a day  - to fill Sep 23, 2010 10)  Iron 325 (65 Fe) Mg Tabs (Ferrous sulfate) .Marland Kitchen.. 1po once daily 11)  Furosemide 40 Mg Tabs (Furosemide) .Marland Kitchen.. 1po once daily 12)  Levothyroxine Sodium 50 Mcg Tabs (Levothyroxine sodium) .Marland Kitchen.. 1po once daily 13)  Aleve 220 Mg Tabs (Naproxen sodium) .Marland Kitchen.. 1po two times a day as needed 14)  Glimepiride 4 Mg Tabs (Glimepiride) .Marland Kitchen.. 1 by mouth once daily 15)  Mycolog Cream  .... Asd as needed 16)  Mvi  .Marland Kitchen.. 1 by mouth once daily 17)  Folic Acid 1 Mg Tabs (Folic acid) .Marland Kitchen..  1po once daily 18)  Metoprolol Tartrate 50 Mg Tabs (Metoprolol tartrate) .Marland Kitchen.. 1 by mouth two times a day 19)  Carisoprodol 350 Mg Tabs (Carisoprodol) 20)  Oxycodone-acetaminophen 10-325 Mg Tabs (Oxycodone-acetaminophen) .Marland Kitchen.. 1 by mouth q 6 hrs as needed - to fill Aug 27, 2010 21)  Onetouch Ultra Blue Strp (Glucose blood) .... Use asd 1 once daily  Patient Instructions: 1)  ok to stop the alendronate as you have  2)  you will be called about the copay for the Prolia for the bones, and if it is not too expensive, we can start with your next visit here 3)  Please go to the Lab in the basement for your blood and/or urine tests today 4)  Please call the number on the [facility] for results of your testing  5)  Continue all previous medications as before this visit  6)  please keep your specialist appts as you do 7)  Please schedule a follow-up appointment in 6 months. Prescriptions:  ALPRAZOLAM 1 MG TABS (ALPRAZOLAM) 1po at bedtime - to fill Aug 27, 2010  #30 x 11   Entered and Authorized by:   Corwin Levins MD   Signed by:   Corwin Levins MD on 08/27/2010   Method used:   Print then Give to Patient   RxID:   [id number] OXYCONTIN 40 MG XR12H-TAB (OXYCODONE HCL) 1 by mouth two times a day  - to fill Sep 23, 2010  #60 x 0   Entered and Authorized by:   Corwin Levins MD   Signed by:   Corwin Levins MD on 08/27/2010   Method used:   Print then Give to Patient   RxID:   [id number] OXYCODONE-ACETAMINOPHEN 10-325 MG TABS (OXYCODONE-ACETAMINOPHEN) 1 by mouth q 6 hrs as needed - to fill Aug 27, 2010  #120 x 0   Entered and Authorized by:   Corwin Levins MD   Signed by:   Corwin Levins MD on 08/27/2010   Method used:   Print then Give to Patient   RxID:   [id number]    Orders Added: 1)  TLB-BMP (Basic Metabolic Panel-BMET) [80048-METABOL] 2)  TLB-Lipid Panel [80061-LIPID] 3)  TLB-A1C / Hgb A1C (Glycohemoglobin) [83036-A1C] 4)  Est. Patient Level IV [01027]

## 2010-09-17 NOTE — Progress Notes (Signed)
Summary: Prolia  Phone Note Outgoing Call   Summary of Call: faxed ins verification req to [employer]..........Marland Kitchenwill wait on summary of benefits. Initial call taken by: Lanier Prude, [facility]),  August 29, 2010 8:59 AM  Follow-up for Phone Call        rec benefit summary for Prol[employer]. Pt will owe $140 deductible and 20% co-ins for administration and cost of Prolia. Follow-up by: Lanier Prude, [facility]),  September 12, 2010 3:12 PM  Additional Follow-up for Phone Call Additional follow up Details #1::        pt informed. She is requesting more info on Prolia. I am mailing her info today and she will call us back if/when she decides to have inj. Additional Follow-up by: Lanier Prude, [facility]),  September 12, 2010 3:12 PM    Additional Follow-up for Phone Call Additional follow up Details #2::    noted Follow-up by: Corwin Levins MD,  September 12, 2010 6:02 PM

## 2010-09-30 ENCOUNTER — Telehealth: Payer: Self-pay | Admitting: Internal Medicine

## 2010-10-08 NOTE — Progress Notes (Signed)
Summary: REQ A CALL   Phone Note Call from Patient Call back at Home Phone [phone]   Summary of Call: Patient is requesting a call back regarding rx refill.  Initial call taken by: Lamar Sprinkles, CMA,  September 30, 2010 3:37 PM  Follow-up for Phone Call        Pt request refill for Oxycodone 10-325 Follow-up by: Margaret Pyle, CMA,  September 30, 2010 4:08 PM  Additional Follow-up for Phone Call Additional follow up Details #1::        done hardcopy to LIM side B - dahlia   Additional Follow-up by: Corwin Levins MD,  September 30, 2010 5:07 PM    Additional Follow-up for Phone Call Additional follow up Details #2::    Pt informed, Rx in cabinet for pt pick up Follow-up by: Margaret Pyle, CMA,  October 01, 2010 8:21 AM  New/Updated Medications: OXYCODONE-ACETAMINOPHEN 10-325 MG TABS (OXYCODONE-ACETAMINOPHEN) 1 by mouth q 6 hrs as needed - to fill Oct 01, 2010 Prescriptions: OXYCODONE-ACETAMINOPHEN 10-325 MG TABS (OXYCODONE-ACETAMINOPHEN) 1 by mouth q 6 hrs as needed - to fill Oct 01, 2010  #120 x 0   Entered by:   Corwin Levins MD   Authorized by:   B LIM Triage Nurse   Signed by:   Corwin Levins MD on 09/30/2010   Method used:   Print then Give to Patient   RxID:   [id number]

## 2010-10-30 ENCOUNTER — Other Ambulatory Visit: Payer: Self-pay | Admitting: Cardiology

## 2010-10-30 DIAGNOSIS — I10 Essential (primary) hypertension: Secondary | ICD-10-CM

## 2010-10-31 ENCOUNTER — Other Ambulatory Visit: Payer: Self-pay

## 2010-10-31 DIAGNOSIS — M549 Dorsalgia, unspecified: Secondary | ICD-10-CM

## 2010-10-31 MED ORDER — OXYCODONE-ACETAMINOPHEN 10-325 MG PO TABS: 1.0000 | ORAL_TABLET | Freq: Four times a day (QID) | ORAL | Status: AC | PRN

## 2010-10-31 MED ORDER — OXYCODONE HCL 40 MG PO TB12: 40.0000 mg | ORAL_TABLET | Freq: Two times a day (BID) | ORAL | Status: AC

## 2010-10-31 NOTE — Telephone Encounter (Signed)
Pt informed, Rx in cabinet for pt pick up  

## 2010-10-31 NOTE — Telephone Encounter (Signed)
Pt called requesting refill of Percocet last written 09/30/2010

## 2010-10-31 NOTE — Telephone Encounter (Signed)
Pt called requesting 3 mth refill of Oxycontin, stating it is $82 for 30 days but $35 for 90. Last written 09/23/2010, please advise.

## 2010-12-03 ENCOUNTER — Telehealth: Payer: Self-pay | Admitting: Cardiology

## 2010-12-03 NOTE — Telephone Encounter (Signed)
She could try Dr. Kirby Funk or one of his partners

## 2010-12-03 NOTE — Telephone Encounter (Signed)
[address]   Who would you recommend?

## 2010-12-03 NOTE — Telephone Encounter (Signed)
WANTS  TO SEE IF YOU CAN REFER HER TO ANOTHER PRIMARY CARE OTHER THAN DR. Oliver Barre. SHE WANTS SOMEONE CLOSER TO HOME.

## 2010-12-03 NOTE — Telephone Encounter (Signed)
Advised patient

## 2010-12-06 NOTE — Discharge Summary (Signed)
NAME:  Krista Clayton, SPORER                         ACCOUNT NO.:  [id number]   MEDICAL RECORD NO.:  [id number]                   PATIENT TYPE:  INP   LOCATION:  5040                                 FACILITY:  [facility]   PHYSICIAN:  Claude Manges. Cleophas Dunker, M.D.            DATE OF BIRTH:  04-21-1929   DATE OF ADMISSION:  03/17/2002  DATE OF DISCHARGE:  03/21/2002                                 DISCHARGE SUMMARY   ADMISSION DIAGNOSES:  1. Right femoral neck fracture.  2. Hypertension.  3. Diabetes mellitus, type 2.  4. Coronary artery disease with a myocardial infarction.   DISCHARGE DIAGNOSES:  1. Right hip hemiarthroplasty.  2. Postoperative blood loss anemia.  3. Hypertension.  4. Diabetes mellitus, type 2.  5. History of coronary artery disease with myocardial infarction.   HISTORY OF PRESENT ILLNESS:  This is a Krista Clayton who was going to the  bathroom in his house on the morning of admission and fell.  He landed,  hitting his head on the counter, landed on his right side.  He denies any  loss of consciousness, chest pain, shortness of breath, or dizziness.  He  had immediate pain in his right hip, was unable to stand, was brought to the  emergency room for evaluation by EMS and was found to have a right hip  femoral neck fracture.  The patient also had a CT evaluation of the head  which was unremarkable.   SURGICAL PROCEDURE:  On March 17, 2002, The patient was taken to the OR by  Dr. Norlene Campbell, assisted by Jamelle Rushing, P.A.-C.  Under general  anesthesia, the patient underwent a right hip bipolar arthroplasty for a  displaced subcapital femoral neck fracture.  There were no complications.  The patient tolerated the procedure well.  No drains were left in place, and  the patient was transferred to the recovery room and then to the orthopedic  floor in good condition.   CONSULTATIONS:  Routine physical therapy, occupational therapy,  rehabilitation consults  requested.   HOSPITAL COURSE:  On 03/17/2002, the patient was admitted through [facility]  ER under the care of Dr. Georgena Spurling. The patient was requested to have a  cardiac clearance due to previous cardiac history.  Dr. Patty Sermons did  evaluate the patient and deemed the patient okay for surgery on this date.  Dr. Cleophas Dunker took the patient to the OR for a right hip bipolar  hemiarthroplasty.  The patient tolerated the procedure well, and there were  no complications.   The patient then incurred a total of four days postoperative care on the  orthopedic floor.  The patient was placed on Coumadin for routine DVT  prophylaxis.  The patient did develop some postoperative blood loss anemia  with hemoglobin dropping to 8.4.  Due to his blood pressure being low and  his cardiac history, he was typed and crossed and transfused 2  units of  packed red blood cells with no untoward events, and his hemoglobin improved  to 10.2.  The patient had no other medical issues or problems during his  hospitalization.  His leg remained neurovascularly intact.  His wound  remained benign without any signs of infection.  His glucose levels were a  little elevated, but this was felt related to the stress of the surgery.   The patient worked with physical therapy on a daily basis and was felt to  need some extra rehabilitation services prior to being discharged home.  He  was evaluated by the rehabilitation services and accepted, and he was  transferred to that unit on postop day #4 in improved and good condition.   LABORATORY DATA:  EKG on admission showed normal sinus rhythm at 64 beats  per minute, nonspecific intraventricular block, and T wave abnormalities.  P, R, T axes of 70, 47, and 41.   CBC on 03/20/2002:  WBC 7.1, hemoglobin 9.3, hematocrit 27.2, platelets 101.  INR 2.0.   The patient received a total of 2 units of packed red blood cells during  this hospitalization.   DISCHARGE MEDICATIONS:   1. Glucophage 500 mg p.o. b.i.d.  2. Zocor 80 mg p.o. q.d.  3. Avapro 80 mg p.o. q.d.  4. Actos 30 mg p.o. q.d.  5. Vioxx 25 mg p.o. q.d.  6. Isosorbide mononitrate 60 mg p.o. q.d.  7. Colace 100 mg p.o. b.i.d.  8. Coumadin 2.5 mg p.o. q.d.  9. Toprol XL 50 mg p.o. q.d.  10.      Cardizem 180 mg p.o. q.d.  11.      OxyContin 10 mg p.o. q.12h.  12.      Nitroglycerin sublingual 0.4 mg p.r.n.  13.      Xanax 0.5 mg p.o. q.8h. p.r.n.  14.      Percocet 1 or 2 tablets every 4 to 6 hours p.r.n.   DISCHARGE INSTRUCTIONS:  1. Medications: The patient is to continue medications as dispensed on     orthopedic floor.  2. Diet: No restrictions.  3. Activity:  The patient may be partially weightbearing with 50% of his     weight on the affected leg with the use of a walker.  4. Wound care:  The patient should have wound checked for signs of     infection.  Staples to be removed on postop day #14.  5. Followup: The patient should have a followup appointment arranged with     Dr. Cleophas Dunker in his office two weeks from date of discharge from the     rehabilitation unit.     Jamelle Rushing, P.A.                      Claude Manges. Cleophas Dunker, M.D.    RWK/MEDQ  D:  04/07/2002  T:  04/09/2002  Job:  [id number]

## 2010-12-06 NOTE — Discharge Summary (Signed)
NAME:  Krista Clayton, Krista Clayton                         ACCOUNT NO.:  [id number]   MEDICAL RECORD NO.:  [id number]                   PATIENT TYPE:  IPS   LOCATION:  4149                                 FACILITY:  [facility]   PHYSICIAN:  Ranelle Oyster, M.D.             DATE OF BIRTH:  Oct 16, 1928   DATE OF ADMISSION:  03/21/2002  DATE OF DISCHARGE:  03/29/2002                                 DISCHARGE SUMMARY   DISCHARGE DIAGNOSES:  1. Right hemiarthroplasty secondary to femoral neck fracture.  2. Anemia.  3. History of cardiovascular disease.  4. History of hypercholesterolemia.  5. History of diabetes mellitus.  6. History of hypertension.   HISTORY OF PRESENT ILLNESS:  The patient is a 77 year old white female with  past history of cardiovascular disease and diabetes mellitus.  She underwent  a right hip hemiarthroplasty on 03/17/2002 for right femoral neck fracture,  displaced after fall, by Dr. Georgena Spurling.  She presently is on Coumadin  for DVT prophylaxis.  PT report at this time indicates the patient is  minimum assistance ambulating 40 feet with rolling walker, transfer sit to  stand with moderate assistance.  Hospital course significant for anemia and  hyperglycemia.  The patient was transferred to [facility]  Department on 03/21/2002.   PAST MEDICAL HISTORY:  1. As above.  2. Increased lipids.  3. Hypertension.  4. Degenerative disk disease.  5. Cardiovascular disease, myocardial infarction.   PAST SURGICAL HISTORY:  1. Appendectomy.  2. Right breast mastectomy.  3. Back surgery.   MEDICATIONS PRIOR TO ADMISSION:  Vicodin, Actos, Lipitor, Glucophage,  Toprol, Cardizem.   PRIMARY CARE PHYSICIAN:  Maisie Fus A. Patty Sermons, M.D.   ALLERGIES:  No known drug allergies   REVIEW OF SYSTEMS:  Significant for chest pain and shortness of breath.   SOCIAL HISTORY:  The patient lives with grandson in apartment, sixth floor.  Ambulates with cane with elevator.   Independent prior to admission.  Her  grandson works at  during the day, positive driving.  She quit smoking and  denies alcohol use.   HOSPITAL COURSE:  The patient was admitted to [facility]  Department on 03/21/2002 for comprehensive inpatient rehabilitation where she  received more than 3 hours of PT and OT therapy daily.  The patient got off  to a slow start, but overall she did very well and was made modified  independent at time of discharge from rehabilitation.  Hospital course was  significant for anemia, increased CBGs, an episode of angina, and pain  intolerance.   At the time of admission, the patient was placed on Lovenox.  She remained  on Coumadin for DVT prophylaxis.  Lovenox was discontinued with INR of  greater than 2 on 03/24/2002.  Throughout her course on rehabilitation, she  complained of significant pain in her right hip.  At the time of admission,  the patient was placed on  oxycodone p.r.n. as well as OxyContin 10 mg p.o.  q.12h.  She remained on OxyContin throughout her entire stay.  This also was  increased to 20 mg q.12h. on 03/28/2002.  She did have some relief once  medication was adjusted.   On rehabilitation day #2, the patient experienced some chest pain,  nonradiating, which was eventually relieved with two sublingual  nitroglycerin.  Nevertheless, cardiac enzymes panel was ordered which came  back negative.  The patient also underwent EKG which showed no significant  cardiac changes.  The patient was placed on rest during that morning which  was on 03/23/2002, and she resumed therapies that afternoon without any  further chest pain.   The patient's sugars were elevated throughout rehabilitation; therefore,  Glucophage was increased from500 b.i.d. to 750 b.i.d.  Diet was changed to  1800 ADA diet.  CBGs showed slight improvement.  Also noted that blood  pressure was elevated; therefore adjustment was made, and Avapro was  increased to 300 mg  p.o. q.d. on 03/24/2002.  The patient was assessed by her  cardiologist primary care Raphaella Larkin, Dr. Patty Sermons, who also made additional  changes to her medication.  On 03/24/2002, he increased Toprol from 500 q.d.  to 100 q.d.  She remained on Trinsicon p.o. b.i.d. for her entire stay on  rehabilitation.   X-rays were performed on her hip on 03/28/2002 at the request of the patient  due to significant pain.  X-rays showed no significant changes.  She  remained on Zocor p.o. 80 mg for hypercholesterolemia.  The patient also  remained on Actos as well as Glucophage for diabetes.  No other adjustments  were made to any other medications.  She also remained on Imdur 60 mg p.o.  q.d. for cardiovascular disease.   No other medical issues occurred while on rehabilitation.   Latest labs indicated that her latest hemoglobin was 9.4, hematocrit 29.2,  white blood cell count 8.3, platelet count 206.  Another hemoglobin at time  of discharge was 8.8.  Latest sodium143, potassium 4.4, chloride 111, CO2  26, glucose 136, BUN 22, creatinine 0.8.  AST 17, ALT 12.  CK 157, CK-MB  2.9, troponin I 0.2.  Urine cultures performed 03/21/2002 demonstrated 7000  colonies, insignificant growth.  Latest INR performed on 03/29/2002 was 2.5.   At the time of discharge,blood pressure was 160/80, respiratory rate  18,pulse 84, temperature 99.3.  CBGs ran from 167 to 173 to 156.  Surgical  incisions demonstrated no signs of infection.  She had 1+ edema.  Incision  was healing well.  The patient's staples were kept in.  She was followed by  Dr. Lequita Halt who examined the wound.   PT report at that time indicated the patient was ambulating modified  independent greater than 50 feet, performed ADLs modified independent. She  still was partial weightbearing 50%.   DISCHARGE MEDICATIONS:  1. OxyContin 20 mg on taper.  2. Oxycodone 5 mg 1 p.o. q.4h. as needed for pain.  3. Glucophage 750 mg twice daily. 4. Actos 10 mg daily.  5.  Imdur 60 mg daily.  6. Toprol 100 mg dialy.  7. Lipitor, resume home dose.  8. Ziac 180 mg, resume at home.  9. Avapro 300 mg daily.  10.      Coumadin 3 mg dialy until 04/17/2002.  11.      Trinsicon 1 tablet twice daily.   No aspirin, ibuprofen, or Aleve while on Coumadin.   ACTIVITY:  She is to  use her walker, partial weightbearing 50%.   SPECIAL INSTRUCTIONS:  No drinking, no driving.  She has [facility]  for PT, OT, and RN. Home health is to check PT/INR, first draw Thursday,  03/31/2002.   DIET:  Low cholesterol, no concentrated sweets.  Check CBGs at least twice  daily and record results.   WOUND CARE:  Staples to be removed early next week or end of this week at  Dr. Jeannett Senior  Lucey's office.   FOLLOW UP:  Follow up Dr. Sherlean Foot early next week.  Follow up with Dr. Faith Rogue as needed.  Followup with Dr. Patty Sermons within  four weeks.      Junie Bame, P.A.                       Ranelle Oyster, M.D.    LH/MEDQ  D:  03/29/2002  T:  03/30/2002  Job:  [id number]   cc:   Mila Homer. Sherlean Foot, M.D.   Thomas A. Patty Sermons, M.D.

## 2010-12-06 NOTE — Op Note (Signed)
NAME:  Krista Clayton, Krista Clayton                         ACCOUNT NO.:  [id number]   MEDICAL RECORD NO.:  [id number]                   PATIENT TYPE:  EMS   LOCATION:  MAJO                                 FACILITY:  [facility]   PHYSICIAN:  Claude Manges. Cleophas Dunker, M.D.            DATE OF BIRTH:  08-05-1928   DATE OF PROCEDURE:  03/17/2002  DATE OF DISCHARGE:                                 OPERATIVE REPORT   PREOPERATIVE DIAGNOSIS:  Displaced subcapital fracture, right hip.   POSTOPERATIVE DIAGNOSIS:  Displaced subcapital fracture, right hip.   PROCEDURE:  Bipolar arthroplasty, right hip.   SURGEON:  Claude Manges. Cleophas Dunker, M.D.   ASSISTANT:  Jamelle Rushing, P.A.-C.   ANESTHESIA:  General oral tracheal.   COMPLICATIONS:  None.   DESCRIPTION OF PROCEDURE:  With the patient comfortable on the operating  room table and under general oral tracheal anesthesia, the patient was  placed in the lateral decubitus position.  The patient was secured to the  operating room table with the Innomed hip system.   The right hip was then prepped with Betadine scrub and then DuraPrep from  the iliac crest to well below the knee.  Sterile draping was performed.   A routine __________ incision was utilized and by sharp dissection carried  down to subcutaneous tissue.  There was some bruising in the subcutaneous  adipose tissue.  This was incised to the level of the iliotibial band.  The  iliotibial band was identified and incised along the length of the skin  incision.  With hip internally rotated, the short external rotators were  identified.  Tendinous structures were tagged with 0 Tycron suture.  The  capsul was then incised.  There was gross, unclotted blood.  A displaced  subcapital fracture was identified.  The corkscrew was inserted to remove  the femoral head which measured 46 mm out at diameter.  We then measured a  46 mm diameter femoral head prosthesis with excellent position.  We also  tried a 47 and felt  that the 46 was a better fit.   The hip was then flexed to visualize the femoral neck and canal.  A starter  hole was then made in the piriformis fossa, followed by the canal finder.  Reaming was performed to 11.5 to accept a 12 mm prosthesis which we had  templated preoperatively.  Rasping was then performed with the 10.5 and then  a 12.0 mm femoral rasp.  Calcar reaming was used to obtain the appropriate  calcar angle.   The 1.5 mm neck length was then applied, followed by the 28 mm trial hip  ball with polyethylene component, followed by the outer 46 mm outer diameter  metal shell.  The entire construct was reduced.  We placed the hip through a  full range of motion, and there was no instability.  We had excellent range  of motion.  Leg lengths appeared to  be symmetrical.   Trial components were then removed; the joint was copiously irrigated with  antibiotic and saline solution.  The femoral component was then carefully  impacted flush in the calcar.  We dried the femoral neck and then applied  the 28 mm hip ball with a 1.5 mm neck length, followed by the polyethylene  shell and the metallic 46 mm on diameter metallic shell.  The acetabulum was  inspected and was clear, and the entire construct was reduced without  invagination of the capsule.  Hip was again placed through a full range of  motion with excellent position, and there was no toggling.   The joint was again irrigated with saline solution and the capsule closed  anatomically with 0 Tycron.  Short external rotators were closed with the  same material.  The wound was again irrigated with saline solution.  The  iliotibial band closed with running 0 Vicryl, the subcu with 0 and 2-0  Vicryl, the skin closed with skin clips.  Sterile bulky dressing was  applied, followed by knee immobilizer.   The patient tolerated the procedure without complications.                                               Claude Manges. Cleophas Dunker,  M.D.    PWW/MEDQ  D:  03/17/2002  T:  03/21/2002  Job:  [id number]

## 2010-12-13 ENCOUNTER — Other Ambulatory Visit: Payer: Self-pay | Admitting: *Deleted

## 2010-12-13 DIAGNOSIS — E7800 Pure hypercholesterolemia, unspecified: Secondary | ICD-10-CM

## 2010-12-16 ENCOUNTER — Other Ambulatory Visit: Payer: Self-pay | Admitting: Cardiology

## 2010-12-17 ENCOUNTER — Other Ambulatory Visit: Payer: Self-pay | Admitting: Cardiology

## 2010-12-17 ENCOUNTER — Other Ambulatory Visit: Payer: Self-pay

## 2010-12-17 ENCOUNTER — Other Ambulatory Visit: Payer: Medicare Other | Admitting: *Deleted

## 2010-12-17 MED ORDER — FUROSEMIDE 40 MG PO TABS: 40.0000 mg | ORAL_TABLET | Freq: Every day | ORAL | Status: DC

## 2010-12-17 MED ORDER — OXYCODONE-ACETAMINOPHEN 10-325 MG PO TABS: 1.0000 | ORAL_TABLET | Freq: Four times a day (QID) | ORAL | Status: AC | PRN

## 2010-12-17 NOTE — Telephone Encounter (Signed)
Faxed hardcopy to pharmacy. 

## 2010-12-18 ENCOUNTER — Encounter: Payer: Self-pay | Admitting: Cardiology

## 2010-12-18 ENCOUNTER — Other Ambulatory Visit: Payer: Self-pay | Admitting: *Deleted

## 2010-12-18 MED ORDER — FUROSEMIDE 40 MG PO TABS: 40.0000 mg | ORAL_TABLET | Freq: Every day | ORAL | Status: DC

## 2010-12-18 NOTE — Telephone Encounter (Signed)
Refills done.

## 2010-12-19 ENCOUNTER — Other Ambulatory Visit (INDEPENDENT_AMBULATORY_CARE_PROVIDER_SITE_OTHER): Payer: Medicare Other | Admitting: *Deleted

## 2010-12-19 ENCOUNTER — Ambulatory Visit (INDEPENDENT_AMBULATORY_CARE_PROVIDER_SITE_OTHER): Payer: Medicare Other | Admitting: Cardiology

## 2010-12-19 ENCOUNTER — Encounter: Payer: Self-pay | Admitting: Cardiology

## 2010-12-19 DIAGNOSIS — E119 Type 2 diabetes mellitus without complications: Secondary | ICD-10-CM

## 2010-12-19 DIAGNOSIS — E7800 Pure hypercholesterolemia, unspecified: Secondary | ICD-10-CM

## 2010-12-19 DIAGNOSIS — I2510 Atherosclerotic heart disease of native coronary artery without angina pectoris: Secondary | ICD-10-CM

## 2010-12-19 DIAGNOSIS — F411 Generalized anxiety disorder: Secondary | ICD-10-CM

## 2010-12-19 LAB — HEPATIC FUNCTION PANEL
AST: 33 U/L (ref 0–37)
Albumin: 4.3 g/dL (ref 3.5–5.2)
Alkaline Phosphatase: 35 U/L — ABNORMAL LOW (ref 39–117)
Total Protein: 6.8 g/dL (ref 6.0–8.3)

## 2010-12-19 LAB — BASIC METABOLIC PANEL
CO2: 25 mEq/L (ref 19–32)
Chloride: 106 mEq/L (ref 96–112)
Glucose, Bld: 140 mg/dL — ABNORMAL HIGH (ref 70–99)
Potassium: 4.7 mEq/L (ref 3.5–5.1)
Sodium: 142 mEq/L (ref 135–145)

## 2010-12-19 LAB — LIPID PANEL: Total CHOL/HDL Ratio: 3

## 2010-12-19 NOTE — Assessment & Plan Note (Signed)
The patient has a history of ischemic heart disease.  She had a failed attempted PTCA in 1998.  She has occasional chest pain consistent with angina pectoris.  She has not had any recent unstable angina symptoms.  She's not having any orthopnea or paroxysmal nocturnal dyspnea or symptoms of congestive heart failure.

## 2010-12-19 NOTE — Progress Notes (Signed)
Krista Clayton Date of Birth:  [birth year] [facility] [address]   [address] [place], [place]  [address] [phone]           Fax   [phone]  History of Present Illness: This pleasant 77 year old woman is seen for a scheduled 4 month followup office visit.  She has a history of essential hypertension and diabetes as well as coronary disease and hypothyroidism.  Since last visit she's had occasional chest discomfort.  She's had occasional headaches.  She notes occasional dyspnea with exertion.  She's not having any orthopnea or paroxysmal nocturnal dyspnea.  She's not expressing any ankle edema.  She had cardiac catheterization in 1998 because of progressive angina after an abnormal Cardiolite.  However the attempt to open the chronic occlusion of right coronary artery was unsuccessful.  Nonetheless following that her chest pain subsided and has not recurred.  She does have problems with intractable back pain and is followed by her neurosurgeon Dr. Channing Mutters  Current Outpatient Prescriptions  Medication Sig Dispense Refill  . ALPRAZolam (XANAX) 1 MG tablet Take 1 mg by mouth at bedtime.        Marland Kitchen aspirin 325 MG tablet Take 325 mg by mouth daily.        Marland Kitchen atorvastatin (LIPITOR) 80 MG tablet Take 80 mg by mouth daily.        . Ferrous Sulfate (IRON) 325 (65 FE) MG TABS Take by mouth daily.        . folic acid (FOLVITE) 1 MG tablet Take 1 mg by mouth daily.        . furosemide (LASIX) 40 MG tablet Take 1 tablet (40 mg total) by mouth daily.  90 tablet  3  . glimepiride (AMARYL) 4 MG tablet Take 4 mg by mouth daily before breakfast.        . isosorbide mononitrate (IMDUR) 120 MG 24 hr tablet Take 120 mg by mouth daily.        Marland Kitchen levothyroxine (SYNTHROID, LEVOTHROID) 50 MCG tablet Take 50 mcg by mouth as directed.        Marland Kitchen losartan (COZAAR) 100 MG tablet Take 100 mg by mouth daily.        . metFORMIN (GLUCOPHAGE) 500 MG tablet Take 500 mg by mouth 2 (two) times daily  with a meal.        . metoprolol (LOPRESSOR) 50 MG tablet TAKE 1 TABLET BY MOUTH TWICE A DAY  180 tablet  3  . Multiple Vitamin (MULTIVITAMIN) tablet Take 1 tablet by mouth daily.        . Multiple Vitamins-Minerals ([employer] SPECTRAVITE ADVANCED PO) Take by mouth daily.        . Naproxen Sodium (ALEVE PO) Take by mouth 3 (three) times daily.        . nitroGLYCERIN (NITROSTAT) 0.4 MG SL tablet Place 0.4 mg under the tongue every 5 (five) minutes as needed.        . OxyCODONE HCl (OXYCONTIN PO) Take by mouth daily. 2 DAILY        . oxyCODONE-acetaminophen (PERCOCET) 10-325 MG per tablet Take 1 tablet by mouth every 6 (six) hours as needed for pain.  120 tablet  0  . DISCONTD: alendronate (FOSAMAX) 70 MG tablet Take 70 mg by mouth every 7 (seven) days. Take with a full glass of water on an empty stomach.         Allergies  Allergen Reactions  . Zestril (Lisinopril) Cough  Patient Active Problem List  Diagnoses  . HYPOTHYROIDISM  . DIABETES MELLITUS, TYPE II  . UNSPECIFIED VITAMIN D DEFICIENCY  . HYPERLIPIDEMIA  . ANEMIA-NOS  . DEMENTIA  . ANXIETY  . DEPRESSION  . HYPERTENSION  . MYOCARDIAL INFARCTION, HX OF  . CORONARY ARTERY DISEASE  . MENOPAUSAL DISORDER  . OSTEOARTHRITIS, SHOULDER, RIGHT  . DISC DISEASE, LUMBAR  . SPINAL STENOSIS, LUMBAR  . BACK PAIN, CHRONIC  . FATIGUE  . MEMORY LOSS  . BREAST CANCER, HX OF  . OSTEOPOROSIS    History  Smoking status  . Former Smoker  . Quit date: 12/17/1980  Smokeless tobacco  . Not on file    History  Alcohol Use No    Family History  Problem Relation Age of Onset  . Heart disease Father   . Heart attack Father   . Stroke Brother   . Cancer Brother     Review of Systems: Constitutional: no fever chills diaphoresis or fatigue or change in weight.  Head and neck: no hearing loss, no epistaxis, no photophobia or visual disturbance. Respiratory: No cough, shortness of breath or wheezing. Cardiovascular: No chest pain  peripheral edema, palpitations. Gastrointestinal: No abdominal distention, no abdominal pain, no change in bowel habits hematochezia or melena. Genitourinary: No dysuria, no frequency, no urgency, no nocturia. Musculoskeletal:No arthralgias, no back pain, no gait disturbance or myalgias. Neurological: No dizziness, no headaches, no numbness, no seizures, no syncope, no weakness, no tremors. Hematologic: No lymphadenopathy, no easy bruising. Psychiatric: No confusion, no hallucinations, no sleep disturbance.    Physical Exam: Filed Vitals:   12/19/10 1125  BP: 140/60  Pulse: 64  The general appearance reveals a well-developed well-nourished woman who is in no distress.  She walks with a walker.Pupils equal and reactive.   Extraocular Movements are full.  There is no scleral icterus.  The mouth and pharynx are normal.  The neck is supple.  The carotids reveal no bruits.  The jugular venous pressure is normal.  The thyroid is not enlarged.  There is no lymphadenopathy.The chest is clear to percussion and auscultation. There are no rales or rhonchi. Expansion of the chest is symmetrical.The precordium is quiet.  The first heart sound is normal.  The second heart sound is physiologically split.  There is no murmur gallop rub or click.  There is no abnormal lift or heave.The abdomen is soft and nontender. Bowel sounds are normal. The liver and spleen are not enlarged. There Are no abdominal masses. There are no bruits.The pedal pulses are good.  There is no phlebitis or edema.  There is no cyanosis or clubbing.Strength is normal and symmetrical in all extremities.  There is no lateralizing weakness.  There are no sensory deficits.The skin is warm and dry.  There is no rash.   Assessment / Plan: Continue same medication.  She is in the process of switching primary care providers.  Recheck here in 4 months for office visit and fasting lab work including TSH and hemoglobin A1c

## 2010-12-19 NOTE — Assessment & Plan Note (Signed)
The patient has a long history of anxiety and depression.  A lot of this is related to the fact that she has a lot of musculoskeletal complaints including chronic low back pain.  She is on OxyContin twice a day from her neurosurgeon Dr. Channing Mutters

## 2010-12-19 NOTE — Assessment & Plan Note (Signed)
The patient is diabetic.  She has chronically elevated triglycerides.  She has not been checking her blood sugars at home because she doesn't understand how to use the machine.  She intends to have someone help her.  She denies any symptomatic hypoglycemia.  She is trying to stay on a careful heart healthy diabetic diet.

## 2010-12-20 ENCOUNTER — Telehealth: Payer: Self-pay | Admitting: *Deleted

## 2010-12-20 NOTE — Telephone Encounter (Signed)
Adv. Patient of labs 

## 2010-12-24 ENCOUNTER — Other Ambulatory Visit: Payer: Self-pay | Admitting: Cardiology

## 2010-12-24 DIAGNOSIS — E119 Type 2 diabetes mellitus without complications: Secondary | ICD-10-CM

## 2010-12-24 NOTE — Telephone Encounter (Signed)
escribe request  

## 2011-01-06 ENCOUNTER — Other Ambulatory Visit: Payer: Self-pay | Admitting: Cardiology

## 2011-01-06 NOTE — Telephone Encounter (Signed)
escribe request  

## 2011-02-25 ENCOUNTER — Ambulatory Visit: Payer: Medicare Other | Admitting: Internal Medicine

## 2011-03-03 ENCOUNTER — Other Ambulatory Visit: Payer: Self-pay | Admitting: *Deleted

## 2011-03-03 DIAGNOSIS — I519 Heart disease, unspecified: Secondary | ICD-10-CM

## 2011-03-03 MED ORDER — FOLIC ACID 1 MG PO TABS: 1.0000 mg | ORAL_TABLET | Freq: Every day | ORAL | Status: DC

## 2011-03-03 NOTE — Telephone Encounter (Signed)
Refilled folic acid to [facility]

## 2011-03-20 ENCOUNTER — Telehealth: Payer: Self-pay | Admitting: Cardiology

## 2011-03-20 NOTE — Telephone Encounter (Signed)
Please advise 

## 2011-03-20 NOTE — Telephone Encounter (Signed)
Advised patient needs to get from back doctor

## 2011-03-20 NOTE — Telephone Encounter (Signed)
No I can't prescribe those medications.  She will need to get it from a back doctor or a pain doctor.

## 2011-03-20 NOTE — Telephone Encounter (Signed)
Pt called said she was seeing a back physician Dr Clent Ridges? And she prescribed oxycontin 40 mg, oxycodone 5/325, and aprazalam 1 mg and she wont return her calls for refill. She wants to know if Dr Patty Sermons will prescribe this for her since she is out of pills Please call

## 2011-04-01 ENCOUNTER — Other Ambulatory Visit: Payer: Self-pay | Admitting: Cardiology

## 2011-04-01 DIAGNOSIS — E785 Hyperlipidemia, unspecified: Secondary | ICD-10-CM

## 2011-04-01 DIAGNOSIS — E039 Hypothyroidism, unspecified: Secondary | ICD-10-CM

## 2011-04-01 DIAGNOSIS — E119 Type 2 diabetes mellitus without complications: Secondary | ICD-10-CM

## 2011-04-01 DIAGNOSIS — I2510 Atherosclerotic heart disease of native coronary artery without angina pectoris: Secondary | ICD-10-CM

## 2011-04-01 DIAGNOSIS — I10 Essential (primary) hypertension: Secondary | ICD-10-CM

## 2011-04-08 ENCOUNTER — Encounter: Payer: Self-pay | Admitting: Cardiology

## 2011-04-08 ENCOUNTER — Ambulatory Visit (INDEPENDENT_AMBULATORY_CARE_PROVIDER_SITE_OTHER): Payer: Medicare Other | Admitting: Cardiology

## 2011-04-08 ENCOUNTER — Other Ambulatory Visit (INDEPENDENT_AMBULATORY_CARE_PROVIDER_SITE_OTHER): Payer: Medicare Other | Admitting: *Deleted

## 2011-04-08 DIAGNOSIS — I2510 Atherosclerotic heart disease of native coronary artery without angina pectoris: Secondary | ICD-10-CM

## 2011-04-08 DIAGNOSIS — I10 Essential (primary) hypertension: Secondary | ICD-10-CM

## 2011-04-08 DIAGNOSIS — M48061 Spinal stenosis, lumbar region without neurogenic claudication: Secondary | ICD-10-CM

## 2011-04-08 DIAGNOSIS — E119 Type 2 diabetes mellitus without complications: Secondary | ICD-10-CM

## 2011-04-08 DIAGNOSIS — E039 Hypothyroidism, unspecified: Secondary | ICD-10-CM

## 2011-04-08 DIAGNOSIS — E785 Hyperlipidemia, unspecified: Secondary | ICD-10-CM

## 2011-04-08 LAB — TSH: TSH: 4.59 u[IU]/mL (ref 0.35–5.50)

## 2011-04-08 LAB — BASIC METABOLIC PANEL
Calcium: 9.8 mg/dL (ref 8.4–10.5)
GFR: 41.89 mL/min — ABNORMAL LOW (ref >60.00)
Glucose, Bld: 165 mg/dL — ABNORMAL HIGH (ref 70–99)
Potassium: 5.2 mEq/L — ABNORMAL HIGH (ref 3.5–5.1)
Sodium: 142 mEq/L (ref 135–145)

## 2011-04-08 LAB — HEPATIC FUNCTION PANEL
AST: 30 U/L (ref 0–37)
Alkaline Phosphatase: 36 U/L — ABNORMAL LOW (ref 39–117)
Bilirubin, Direct: 0.1 mg/dL (ref 0.0–0.3)
Total Bilirubin: 0.6 mg/dL (ref 0.3–1.2)

## 2011-04-08 LAB — HEMOGLOBIN A1C: Hgb A1c MFr Bld: 7.2 % — ABNORMAL HIGH (ref 4.6–6.5)

## 2011-04-08 LAB — LIPID PANEL
HDL: 44.9 mg/dL (ref >39.00)
LDL Cholesterol: 58 mg/dL (ref 0–99)
Total CHOL/HDL Ratio: 3
VLDL: 26.8 mg/dL (ref 0.0–40.0)

## 2011-04-08 NOTE — Assessment & Plan Note (Signed)
The patient continues to have debilitating chronic low back pain.  She is now being followed in a different pain clinic [facility] from [facility] hospital

## 2011-04-08 NOTE — Assessment & Plan Note (Signed)
The patient has been having infrequent chest pain.  Her nitroglycerin are outdated and we gave her a new prescription today.

## 2011-04-08 NOTE — Progress Notes (Signed)
Krista Clayton Date of Birth:  03-Dec-1928 [facility] / [facility] [address]   [address] [place], [place]  [address] [phone]           Fax   [phone]  History of Present Illness:  This pleasant 77 year old woman is seen for a four-month followup office visit.  She has a history of essential hypertension and diabetes as well as coronary disease and hypothyroidism.  Since last visit she has done reasonably well.  She is no longer on metformin.  Her primary care provider whose name she cannot recall took her off metformin and put her on glimepiride.  Patient has not been experiencing any recent chest discomfort.  She's had none in the past week.  We did give her a new prescription for nitroglycerin today her request.  She still has a lot of problems with her back and is followed in the pain clinic.  She does have known coronary artery disease.  She had an unsuccessful attempt at opening a chronic occlusion of the right coronary artery in 1998.  Current Outpatient Prescriptions  Medication Sig Dispense Refill  . ALPRAZolam (XANAX) 1 MG tablet Take 1 mg by mouth at bedtime.        Marland Kitchen aspirin 325 MG tablet Take 325 mg by mouth daily.        Marland Kitchen atorvastatin (LIPITOR) 80 MG tablet Take 80 mg by mouth daily.        . Ferrous Sulfate (IRON) 325 (65 FE) MG TABS Take by mouth daily.        . folic acid (FOLVITE) 1 MG tablet Take 1 tablet (1 mg total) by mouth daily.  90 tablet  3  . furosemide (LASIX) 40 MG tablet Take 1 tablet (40 mg total) by mouth daily.  90 tablet  3  . glimepiride (AMARYL) 4 MG tablet TAKE 1 TABLET BY MOUTH EVERY DAY  90 tablet  4  . isosorbide mononitrate (IMDUR) 120 MG 24 hr tablet TAKE 1 TABLET BY MOUTH EVERY DAY  90 tablet  4  . levothyroxine (SYNTHROID, LEVOTHROID) 50 MCG tablet Take 50 mcg by mouth as directed.        Marland Kitchen losartan (COZAAR) 100 MG tablet Take 100 mg by mouth daily.        . metFORMIN (GLUCOPHAGE) 500 MG tablet Take 500 mg by mouth 2 (two)  times daily with a meal.        . metoprolol (LOPRESSOR) 50 MG tablet TAKE 1 TABLET BY MOUTH TWICE A DAY  180 tablet  3  . Multiple Vitamin (MULTIVITAMIN) tablet Take 1 tablet by mouth daily.        . Multiple Vitamins-Minerals (CVS SPECTRAVITE ADVANCED PO) Take by mouth daily.        . Naproxen Sodium (ALEVE PO) Take by mouth 3 (three) times daily.        . nitroGLYCERIN (NITROSTAT) 0.4 MG SL tablet Place 0.4 mg under the tongue every 5 (five) minutes as needed.        . OxyCODONE HCl (OXYCONTIN PO) Take by mouth daily. 2 DAILY          Allergies  Allergen Reactions  . Zestril (Lisinopril) Cough    Patient Active Problem List  Diagnoses  . HYPOTHYROIDISM  . DIABETES MELLITUS, TYPE II  . UNSPECIFIED VITAMIN D DEFICIENCY  . HYPERLIPIDEMIA  . ANEMIA-NOS  . DEMENTIA  . ANXIETY  . DEPRESSION  . HYPERTENSION  . MYOCARDIAL INFARCTION, HX OF  .  CORONARY ARTERY DISEASE  . MENOPAUSAL DISORDER  . OSTEOARTHRITIS, SHOULDER, RIGHT  . DISC DISEASE, LUMBAR  . SPINAL STENOSIS, LUMBAR  . BACK PAIN, CHRONIC  . FATIGUE  . MEMORY LOSS  . BREAST CANCER, HX OF  . OSTEOPOROSIS    History  Smoking status  . Former Smoker  . Quit date: 12/17/1980  Smokeless tobacco  . Not on file    History  Alcohol Use No    Family History  Problem Relation Age of Onset  . Heart disease Father   . Heart attack Father   . Stroke Brother   . Cancer Brother     Review of Systems: Constitutional: no fever chills diaphoresis or fatigue or change in weight.  Head and neck: no hearing loss, no epistaxis, no photophobia or visual disturbance. Respiratory: No cough, shortness of breath or wheezing. Cardiovascular: No chest pain peripheral edema, palpitations. Gastrointestinal: No abdominal distention, no abdominal pain, no change in bowel habits hematochezia or melena. Genitourinary: No dysuria, no frequency, no urgency, no nocturia. Musculoskeletal:No arthralgias, no back pain, no gait disturbance  or myalgias. Neurological: No dizziness, no headaches, no numbness, no seizures, no syncope, no weakness, no tremors. Hematologic: No lymphadenopathy, no easy bruising. Psychiatric: No confusion, no hallucinations, no sleep disturbance.    Physical Exam: Filed Vitals:   04/08/11 1735  BP: 120/70  Pulse: 60  The general appearance reveals an elderly woman who walks with a rolling walker.Pupils equal and reactive.   Extraocular Movements are full.  There is no scleral icterus.  The mouth and pharynx are normal.  The neck is supple.  The carotids reveal no bruits.  The jugular venous pressure is normal.  The thyroid is not enlarged.  There is no lymphadenopathy.  The chest is clear to percussion and auscultation. There are no rales or rhonchi. Expansion of the chest is symmetrical.  The precordium is quiet.  The first heart sound is normal.  The second heart sound is physiologically split.  There is no murmur gallop rub or click.  There is no abnormal lift or heave.  The abdomen is soft and nontender. Bowel sounds are normal. The liver and spleen are not enlarged. There Are no abdominal masses. There are no bruits.  The pedal pulses are good.  There is no phlebitis or edema.  There is no cyanosis or clubbing.  The skin is warm and dry.  There is no rash.    Neurologic reveals generalized weakness   Assessment / Plan: Continue same medication.  Recheck 4 months

## 2011-04-08 NOTE — Assessment & Plan Note (Signed)
The patient has not been experiencing any recent problems with her blood pressure.  Denies any recent headaches or dizzy spells

## 2011-04-10 ENCOUNTER — Telehealth: Payer: Self-pay | Admitting: *Deleted

## 2011-04-10 NOTE — Telephone Encounter (Signed)
Message copied by Royanne Foots on Thu Apr 10, 2011 10:54 AM ------      Message from: Cassell Clement      Created: Wed Apr 09, 2011  2:00 PM       Please report.  The hemoglobin A1c is about the same at 7.2.  The thyroid function is normal.The liver tests are normal.The lipids are normal.The blood sugar is higher at 165.  The kidney function is stable.  The potassium is high at 5.2 so avoid salt substitute and high potassium foods.  Continue same medication.

## 2011-04-10 NOTE — Progress Notes (Signed)
Advised patient of lab results  

## 2011-04-10 NOTE — Telephone Encounter (Signed)
Advised patient of lab results  

## 2011-04-21 ENCOUNTER — Telehealth: Payer: Self-pay | Admitting: Cardiology

## 2011-04-21 NOTE — Telephone Encounter (Signed)
Gave her Dr Deirdre Priest number

## 2011-04-21 NOTE — Telephone Encounter (Signed)
Pt wants to know who she should go to to have her toenails trimmed. Who would he suggest?  Please call and advise.

## 2011-07-31 ENCOUNTER — Telehealth: Payer: Self-pay | Admitting: Cardiology

## 2011-07-31 NOTE — Telephone Encounter (Signed)
New Problem  Patient would like to know if she should be fasting for 08/06/11 appnt w/ Dr. Patty Sermons.  Please return call

## 2011-07-31 NOTE — Telephone Encounter (Signed)
No labs ordered ok to eat, advised patient

## 2011-08-06 ENCOUNTER — Ambulatory Visit (INDEPENDENT_AMBULATORY_CARE_PROVIDER_SITE_OTHER): Payer: Medicare Other | Admitting: Cardiology

## 2011-08-06 ENCOUNTER — Encounter: Payer: Self-pay | Admitting: Cardiology

## 2011-08-06 VITALS — BP 130/76 | HR 60 | Ht 63.0 in | Wt 151.0 lb

## 2011-08-06 DIAGNOSIS — I2510 Atherosclerotic heart disease of native coronary artery without angina pectoris: Secondary | ICD-10-CM

## 2011-08-06 DIAGNOSIS — E119 Type 2 diabetes mellitus without complications: Secondary | ICD-10-CM

## 2011-08-06 DIAGNOSIS — E7800 Pure hypercholesterolemia, unspecified: Secondary | ICD-10-CM

## 2011-08-06 DIAGNOSIS — R413 Other amnesia: Secondary | ICD-10-CM

## 2011-08-06 DIAGNOSIS — M1990 Unspecified osteoarthritis, unspecified site: Secondary | ICD-10-CM

## 2011-08-06 DIAGNOSIS — I119 Hypertensive heart disease without heart failure: Secondary | ICD-10-CM

## 2011-08-06 DIAGNOSIS — I10 Essential (primary) hypertension: Secondary | ICD-10-CM

## 2011-08-06 NOTE — Assessment & Plan Note (Signed)
The patient has not been having any headaches or dizzy spells.  No symptoms of congestive heart failure.  Her pressure has been satisfactory recently.

## 2011-08-06 NOTE — Progress Notes (Signed)
Krista Clayton Date of Birth:  09-22-1928 [facility] [address] [place], [place]  [address] [phone]         Fax   [phone]  History of Present Illness: This pleasant 78 year old woman is seen for a scheduled followup office visit.  She has a history of diabetes, hypertension, coronary disease, and hypothyroidism.  She also has severe low back pain.  She is followed by Dr. Channing Mutters and she will see him in the office next month.  She has been sent by her primary care physician to a pain management clinic.  She states that at the pain management clinic she was placed on gabapentin which caused her to be confused and disoriented and we have marked out as an allergy from now on.  She has had occasional chest tightness.  She has not had any symptoms of congestive heart failure.  Current Outpatient Prescriptions  Medication Sig Dispense Refill  . ALPRAZolam (XANAX) 1 MG tablet Take 1 mg by mouth at bedtime.        Marland Kitchen aspirin 325 MG tablet Take 325 mg by mouth daily.        Marland Kitchen atorvastatin (LIPITOR) 80 MG tablet Take 80 mg by mouth daily.        . Ferrous Sulfate (IRON) 325 (65 FE) MG TABS Take by mouth daily.        . folic acid (FOLVITE) 1 MG tablet Take 1 tablet (1 mg total) by mouth daily.  90 tablet  3  . furosemide (LASIX) 40 MG tablet Take 1 tablet (40 mg total) by mouth daily.  90 tablet  3  . glimepiride (AMARYL) 4 MG tablet TAKE 1 TABLET BY MOUTH EVERY DAY  90 tablet  4  . isosorbide mononitrate (IMDUR) 120 MG 24 hr tablet TAKE 1 TABLET BY MOUTH EVERY DAY  90 tablet  4  . levothyroxine (SYNTHROID, LEVOTHROID) 50 MCG tablet Take 50 mcg by mouth as directed.        Marland Kitchen losartan (COZAAR) 100 MG tablet Take 100 mg by mouth daily.        . metFORMIN (GLUCOPHAGE) 500 MG tablet Take 500 mg by mouth 2 (two) times daily with a meal.        . metoprolol (LOPRESSOR) 50 MG tablet TAKE 1 TABLET BY MOUTH TWICE A DAY  180 tablet  3  . Multiple Vitamin (MULTIVITAMIN) tablet Take  1 tablet by mouth daily.        . Multiple Vitamins-Minerals (CVS SPECTRAVITE ADVANCED PO) Take by mouth daily.        . Naproxen Sodium (ALEVE PO) Take by mouth 3 (three) times daily.        . nitroGLYCERIN (NITROSTAT) 0.4 MG SL tablet Place 0.4 mg under the tongue every 5 (five) minutes as needed.        . OxyCODONE HCl (OXYCONTIN PO) Take by mouth daily. 2 DAILY          Allergies  Allergen Reactions  . Gabapentin     disorientation  . Zestril (Lisinopril) Cough    Patient Active Problem List  Diagnoses  . HYPOTHYROIDISM  . DIABETES MELLITUS, TYPE II  . UNSPECIFIED VITAMIN D DEFICIENCY  . HYPERLIPIDEMIA  . ANEMIA-NOS  . DEMENTIA  . ANXIETY  . DEPRESSION  . HYPERTENSION  . MYOCARDIAL INFARCTION, HX OF  . CORONARY ARTERY DISEASE  . MENOPAUSAL DISORDER  . OSTEOARTHRITIS, SHOULDER, RIGHT  . DISC DISEASE, LUMBAR  . SPINAL STENOSIS, LUMBAR  .  BACK PAIN, CHRONIC  . FATIGUE  . MEMORY LOSS  . BREAST CANCER, HX OF  . OSTEOPOROSIS    History  Smoking status  . Former Smoker  . Quit date: 12/17/1980  Smokeless tobacco  . Not on file    History  Alcohol Use No    Family History  Problem Relation Age of Onset  . Heart disease Father   . Heart attack Father   . Stroke Brother   . Cancer Brother     Review of Systems: Constitutional: no fever chills diaphoresis or fatigue or change in weight.  Head and neck: no hearing loss, no epistaxis, no photophobia or visual disturbance. Respiratory: No cough, shortness of breath or wheezing. Cardiovascular: No chest pain peripheral edema, palpitations. Gastrointestinal: No abdominal distention, no abdominal pain, no change in bowel habits hematochezia or melena. Genitourinary: No dysuria, no frequency, no urgency, no nocturia. Musculoskeletal:No arthralgias, no back pain, no gait disturbance or myalgias. Neurological: No dizziness, no headaches, no numbness, no seizures, no syncope, no weakness, no tremors. Hematologic: No  lymphadenopathy, no easy bruising. Psychiatric: No confusion, no hallucinations, no sleep disturbance.    Physical Exam: Filed Vitals:   08/06/11 1407  BP: 130/76  Pulse: 60   the general appearance reveals a well-developed elderly woman in no acute distress.  She is examined in her wheelchair.Pupils equal and reactive.   Extraocular Movements are full.  There is no scleral icterus.  The mouth and pharynx are normal.  The neck is supple.  The carotids reveal no bruits.  The jugular venous pressure is normal.  The thyroid is not enlarged.  There is no lymphadenopathy.  The chest is clear to percussion and auscultation. There are no rales or rhonchi. Expansion of the chest is symmetrical.  The precordium is quiet.  The first heart sound is normal.  The second heart sound is physiologically split.  There is no murmur gallop rub or click.  There is no abnormal lift or heave.  The abdomen is soft and nontender. Bowel sounds are normal. The liver and spleen are not enlarged. There Are no abdominal masses. There are no bruits.  The pedal pulses are good.  There is no phlebitis or edema.  There is no cyanosis or clubbing. The skin is warm and dry.  There is no rash. Neurologic exam reveals pain on motion of the lower back   Assessment / Plan: Continue same medication.  Recheck in 4 months for office visit EKG lipid panel hepatic function panel basal metabolic panel and CBC

## 2011-08-06 NOTE — Assessment & Plan Note (Signed)
The patient has not been expressing any hypoglycemic episodes from her diabetes.

## 2011-08-06 NOTE — Assessment & Plan Note (Signed)
No significant change in her mild memory loss since last visit.

## 2011-08-06 NOTE — Patient Instructions (Signed)
Your physician recommends that you continue on your current medications as directed. Please refer to the Current Medication list given to you today. Your physician wants you to follow-up in: 4 months You will receive a reminder letter in the mail two months in advance. If you don't receive a letter, please call our office to schedule the follow-up appointment.  

## 2011-08-07 ENCOUNTER — Telehealth: Payer: Self-pay | Admitting: Cardiology

## 2011-08-07 NOTE — Telephone Encounter (Signed)
Discussed aleve use with current med's, (she is not on other pain med's currently ie, oxycodone)  told her it would be ok to take three times a day 8 hours a part and may use tylenol in between for pain.

## 2011-08-07 NOTE — Telephone Encounter (Signed)
New Msg: Pt calling wanting to know if pt can take a pain medication considering other medications pt is taking. Please return pt call to discuss further.

## 2011-08-28 ENCOUNTER — Other Ambulatory Visit: Payer: Self-pay | Admitting: *Deleted

## 2011-08-28 MED ORDER — LOSARTAN POTASSIUM 100 MG PO TABS: 100.0000 mg | ORAL_TABLET | Freq: Every day | ORAL | Status: DC

## 2011-08-28 NOTE — Telephone Encounter (Signed)
Refilled losartan 

## 2011-09-23 ENCOUNTER — Other Ambulatory Visit: Payer: Self-pay

## 2011-09-23 MED ORDER — LEVOTHYROXINE SODIUM 50 MCG PO TABS: 50.0000 ug | ORAL_TABLET | ORAL | Status: DC

## 2011-09-29 ENCOUNTER — Other Ambulatory Visit: Payer: Self-pay | Admitting: *Deleted

## 2011-09-29 MED ORDER — ATORVASTATIN CALCIUM 80 MG PO TABS: 80.0000 mg | ORAL_TABLET | Freq: Every day | ORAL | Status: DC

## 2011-09-29 NOTE — Telephone Encounter (Signed)
Refilled atorvastatin

## 2011-10-08 ENCOUNTER — Telehealth: Payer: Self-pay | Admitting: Cardiology

## 2011-10-08 NOTE — Telephone Encounter (Signed)
Noted  

## 2011-10-08 NOTE — Telephone Encounter (Signed)
New Problem:    Patient called in wanting to report that she is now taking glipicide per Dr. Loraine Maple. She is now thinking about leaving Dr. Clent Ridges.

## 2011-10-27 ENCOUNTER — Other Ambulatory Visit: Payer: Self-pay | Admitting: Cardiology

## 2011-11-03 ENCOUNTER — Other Ambulatory Visit: Payer: Self-pay | Admitting: Cardiology

## 2011-11-03 ENCOUNTER — Other Ambulatory Visit: Payer: Self-pay | Admitting: *Deleted

## 2011-11-03 MED ORDER — ALPRAZOLAM 1 MG PO TABS: 1.0000 mg | ORAL_TABLET | Freq: Every day | ORAL | Status: DC

## 2011-11-03 MED ORDER — METFORMIN HCL 500 MG PO TABS: 500.0000 mg | ORAL_TABLET | Freq: Two times a day (BID) | ORAL | Status: DC

## 2011-11-03 NOTE — Telephone Encounter (Signed)
Pt is unable to go to PCP sue to being under the wether and she wants Dr. Patty Sermons to order it until she can get to her PCP because she was told by them they could not order it until she came in for an appt

## 2011-11-06 ENCOUNTER — Telehealth: Payer: Self-pay | Admitting: Cardiology

## 2011-11-06 NOTE — Telephone Encounter (Signed)
Her PCP had discontinued her Metformin last year and advised that it was never taken off her medication list.  Explained to patient that's why we give the AVS and ask patients to compare medications with those they are taking at home and to call if it is not correct.  Patient stated she didn't receive one at her last office visit.  I did explain that should would have we received and they are given to all patients at time of visit.  Also explained we are not taking care of her diabetes and further refills need to come from her PCP and this was refilled upon the request of the pharmacy.  Advised she should only take the medication her PCP advised her to take.  Patient verbalized understanding.  Did call pharmacy and cancelled any refills on Metformin.

## 2011-11-06 NOTE — Telephone Encounter (Signed)
New Problem:     Patient called in because she received a refill of metFORMIN (GLUCOPHAGE) 500 MG tablet and she was told not to take it any more last June.  Please call back.

## 2011-12-02 ENCOUNTER — Encounter: Payer: Self-pay | Admitting: Cardiology

## 2011-12-02 ENCOUNTER — Ambulatory Visit (INDEPENDENT_AMBULATORY_CARE_PROVIDER_SITE_OTHER): Payer: Medicare Other | Admitting: Cardiology

## 2011-12-02 ENCOUNTER — Other Ambulatory Visit: Payer: Medicare Other

## 2011-12-02 VITALS — BP 147/72 | HR 56 | Ht 63.0 in | Wt 152.0 lb

## 2011-12-02 DIAGNOSIS — I10 Essential (primary) hypertension: Secondary | ICD-10-CM

## 2011-12-02 DIAGNOSIS — E039 Hypothyroidism, unspecified: Secondary | ICD-10-CM

## 2011-12-02 DIAGNOSIS — I2510 Atherosclerotic heart disease of native coronary artery without angina pectoris: Secondary | ICD-10-CM

## 2011-12-02 DIAGNOSIS — E119 Type 2 diabetes mellitus without complications: Secondary | ICD-10-CM

## 2011-12-02 DIAGNOSIS — I119 Hypertensive heart disease without heart failure: Secondary | ICD-10-CM

## 2011-12-02 DIAGNOSIS — E7800 Pure hypercholesterolemia, unspecified: Secondary | ICD-10-CM

## 2011-12-02 LAB — BASIC METABOLIC PANEL
CO2: 26 mEq/L (ref 19–32)
Chloride: 105 mEq/L (ref 96–112)
Glucose, Bld: 249 mg/dL — ABNORMAL HIGH (ref 70–99)
Potassium: 4.2 mEq/L (ref 3.5–5.1)
Sodium: 140 mEq/L (ref 135–145)

## 2011-12-02 LAB — CBC WITH DIFFERENTIAL/PLATELET
Basophils Absolute: 0 10*3/uL (ref 0.0–0.1)
Basophils Relative: 0.3 % (ref 0.0–3.0)
Eosinophils Absolute: 0.1 10*3/uL (ref 0.0–0.7)
HCT: 32.5 % — ABNORMAL LOW (ref 36.0–46.0)
Hemoglobin: 10.6 g/dL — ABNORMAL LOW (ref 12.0–15.0)
Lymphs Abs: 1.5 10*3/uL (ref 0.7–4.0)
MCHC: 32.7 g/dL (ref 30.0–36.0)
MCV: 97.1 fl (ref 78.0–100.0)
Neutro Abs: 6.1 10*3/uL (ref 1.4–7.7)
RDW: 13.7 % (ref 11.5–14.6)

## 2011-12-02 LAB — LIPID PANEL
HDL: 48.3 mg/dL (ref >39.00)
Triglycerides: 202 mg/dL — ABNORMAL HIGH (ref 0.0–149.0)
VLDL: 40.4 mg/dL — ABNORMAL HIGH (ref 0.0–40.0)

## 2011-12-02 LAB — HEPATIC FUNCTION PANEL
AST: 26 U/L (ref 0–37)
Total Bilirubin: 0.6 mg/dL (ref 0.3–1.2)

## 2011-12-02 NOTE — Progress Notes (Signed)
Quick Note:  Please report to patient. The recent labs are stable. Continue same medication and careful diet. Cholesterol good. Diabetes not as good. BS higher. TG higher. Followup with PCP re diabetes. Anemia unchanged. ______

## 2011-12-02 NOTE — Patient Instructions (Addendum)
Will obtain labs today and call you with the results  Your physician recommends that you continue on your current medications as directed. Please refer to the Current Medication list given to you today.   Your physician recommends that you schedule a follow-up appointment in: 4 months with fasting labs (lp/bmet/hfp/tsh)  

## 2011-12-02 NOTE — Progress Notes (Signed)
Krista Clayton Date of Birth:  05-Jan-1929 [facility] [address] [place], [place]  [address] [phone]         Fax   [phone]  History of Present Illness: This pleasant 78 year old woman is seen for a scheduled four-month followup office visit.  She has a complex past medical history.  She has a history of diabetes, hypertension, ischemic heart disease, and hypothyroidism.  She also has severe low back pain for which she is followed by neurosurgery.  In the past she has also been followed in the pain management clinic.  He has a history of mild memory loss, hypercholesterolemia, diabetes, and osteoarthritis.  Current Outpatient Prescriptions  Medication Sig Dispense Refill  . ALPRAZolam (XANAX) 1 MG tablet Take 1 tablet (1 mg total) by mouth at bedtime.  30 tablet  0  . aspirin 325 MG tablet Take 325 mg by mouth daily.        Marland Kitchen atorvastatin (LIPITOR) 80 MG tablet Take 1 tablet (80 mg total) by mouth daily.  90 tablet  3  . Ferrous Sulfate (IRON) 325 (65 FE) MG TABS Take by mouth daily.        . folic acid (FOLVITE) 1 MG tablet Take 1 tablet (1 mg total) by mouth daily.  90 tablet  3  . furosemide (LASIX) 40 MG tablet Take 1 tablet (40 mg total) by mouth daily.  90 tablet  3  . glimepiride (AMARYL) 4 MG tablet TAKE 1 TABLET BY MOUTH EVERY DAY  90 tablet  4  . isosorbide mononitrate (IMDUR) 120 MG 24 hr tablet TAKE 1 TABLET BY MOUTH EVERY DAY  90 tablet  4  . levothyroxine (SYNTHROID, LEVOTHROID) 50 MCG tablet Take 1 tablet (50 mcg total) by mouth as directed.  90 tablet  0  . losartan (COZAAR) 100 MG tablet Take 1 tablet (100 mg total) by mouth daily.  90 tablet  3  . metoprolol (LOPRESSOR) 50 MG tablet TAKE 1 TABLET BY MOUTH TWICE A DAY  180 tablet  3  . Multiple Vitamin (MULTIVITAMIN) tablet Take 1 tablet by mouth daily.        . Multiple Vitamins-Minerals (CVS SPECTRAVITE ADVANCED PO) Take by mouth daily.        . Naproxen Sodium (ALEVE PO) Take by mouth 3  (three) times daily.        . nitroGLYCERIN (NITROSTAT) 0.4 MG SL tablet Place 0.4 mg under the tongue every 5 (five) minutes as needed.        . OxyCODONE HCl (OXYCONTIN PO) Take by mouth daily. 2 DAILY          Allergies  Allergen Reactions  . Gabapentin     disorientation  . Zestril (Lisinopril) Cough    Patient Active Problem List  Diagnoses  . HYPOTHYROIDISM  . DIABETES MELLITUS, TYPE II  . UNSPECIFIED VITAMIN D DEFICIENCY  . HYPERLIPIDEMIA  . ANEMIA-NOS  . DEMENTIA  . ANXIETY  . DEPRESSION  . HYPERTENSION  . MYOCARDIAL INFARCTION, HX OF  . CORONARY ARTERY DISEASE  . MENOPAUSAL DISORDER  . OSTEOARTHRITIS, SHOULDER, RIGHT  . DISC DISEASE, LUMBAR  . SPINAL STENOSIS, LUMBAR  . BACK PAIN, CHRONIC  . FATIGUE  . MEMORY LOSS  . BREAST CANCER, HX OF  . OSTEOPOROSIS    History  Smoking status  . Former Smoker  . Quit date: 12/17/1980  Smokeless tobacco  . Not on file    History  Alcohol Use No  Family History  Problem Relation Age of Onset  . Heart disease Father   . Heart attack Father   . Stroke Brother   . Cancer Brother     Review of Systems: Constitutional: no fever chills diaphoresis or fatigue or change in weight.  Head and neck: no hearing loss, no epistaxis, no photophobia or visual disturbance. Respiratory: No cough, shortness of breath or wheezing. Cardiovascular: No chest pain peripheral edema, palpitations. Gastrointestinal: No abdominal distention, no abdominal pain, no change in bowel habits hematochezia or melena. Genitourinary: No dysuria, no frequency, no urgency, no nocturia. Musculoskeletal:No arthralgias, no back pain, no gait disturbance or myalgias. Neurological: No dizziness, no headaches, no numbness, no seizures, no syncope, no weakness, no tremors. Hematologic: No lymphadenopathy, no easy bruising. Psychiatric: No confusion, no hallucinations, no sleep disturbance.    Physical Exam: Filed Vitals:   12/02/11 0848  BP:  147/72  Pulse: 56   the general appearance reveals a well-developed well-nourished elderly woman who walks with a rolling walker.Pupils equal and reactive.   Extraocular Movements are full.  There is no scleral icterus.  The mouth and pharynx are normal.  The neck is supple.  The carotids reveal no bruits.  The jugular venous pressure is normal.  The thyroid is not enlarged.  There is no lymphadenopathy.  The chest is clear to percussion and auscultation. There are no rales or rhonchi. Expansion of the chest is symmetrical.  The precordium is quiet.  The first heart sound is normal.  The second heart sound is physiologically split.  There is no murmur gallop rub or click.  There is no abnormal lift or heave.  Rhythm is regular.The abdomen is soft and nontender. Bowel sounds are normal. The liver and spleen are not enlarged. There Are no abdominal masses. There are no bruits.  The pedal pulses are good.  There is no phlebitis or edema.  There is no cyanosis or clubbing. Strength is normal and symmetrical in all extremities.  There is no lateralizing weakness.  There are no sensory deficits.  The skin is warm and dry.  There is no rash.      Assessment / Plan: The patient is getting blood work today results pending.  She will continue same medication.  Continue to followup with Dr. Clent Ridges regarding her diabetes.  She will followup with Dr. Channing Mutters concerning her chronic low back pain.  Recheck here in 4 months for followup office visit lipid panel hepatic function panel basal metabolic panel and TSH.

## 2011-12-02 NOTE — Assessment & Plan Note (Signed)
Since last visit the patient has had very little angina pectoris.  She rarely has to take any sublingual nitroglycerin.  Sometimes a cold environment will cause her to have angina.  He complains that her leg and lower is failing to keep the apartments with enough heat in the winter.

## 2011-12-02 NOTE — Assessment & Plan Note (Signed)
This patient has not been experiencing any hypoglycemic episodes.  She states that her primary care physician Dr. Clent Ridges has been adjusting her diabetes medicine and apparently she is on glipizide now instead of glimepiride.

## 2011-12-02 NOTE — Assessment & Plan Note (Signed)
The blood pressure has been stable on current therapy.  She has not been having a dizzy spells or syncope.  She's not had any symptoms of congestive heart failure.

## 2011-12-03 ENCOUNTER — Telehealth: Payer: Self-pay | Admitting: *Deleted

## 2011-12-03 NOTE — Telephone Encounter (Signed)
Message copied by Burnell Blanks on Wed Dec 03, 2011 10:04 AM ------      Message from: Cassell Clement      Created: Tue Dec 02, 2011  8:27 PM       Please report to patient.  The recent labs are stable. Continue same medication and careful diet. Cholesterol good. Diabetes not as good.  BS higher. TG higher. Followup with PCP re diabetes. Anemia unchanged.

## 2011-12-03 NOTE — Telephone Encounter (Signed)
Advised of labs 

## 2011-12-05 ENCOUNTER — Other Ambulatory Visit: Payer: Self-pay | Admitting: Cardiology

## 2011-12-05 DIAGNOSIS — G4700 Insomnia, unspecified: Secondary | ICD-10-CM

## 2011-12-05 NOTE — Telephone Encounter (Signed)
Please return call to patient at [phone], she would like to discuss medical treatment

## 2011-12-06 ENCOUNTER — Other Ambulatory Visit: Payer: Self-pay | Admitting: Internal Medicine

## 2011-12-08 NOTE — Telephone Encounter (Signed)
Did advise this would be the last ok, further refills from PCP

## 2011-12-09 ENCOUNTER — Other Ambulatory Visit: Payer: Self-pay | Admitting: Cardiology

## 2011-12-09 MED ORDER — FUROSEMIDE 40 MG PO TABS: 40.0000 mg | ORAL_TABLET | Freq: Every day | ORAL | Status: DC

## 2011-12-15 ENCOUNTER — Encounter (HOSPITAL_COMMUNITY): Payer: Self-pay | Admitting: *Deleted

## 2011-12-15 ENCOUNTER — Emergency Department (HOSPITAL_COMMUNITY): Payer: Medicare Other

## 2011-12-15 ENCOUNTER — Emergency Department (HOSPITAL_COMMUNITY)
Admission: EM | Admit: 2011-12-15 | Discharge: 2011-12-15 | Disposition: A | Discharge status: Home / Self Care | Payer: Medicare Other | Attending: Emergency Medicine | Admitting: Emergency Medicine

## 2011-12-15 DIAGNOSIS — R5381 Other malaise: Secondary | ICD-10-CM | POA: Insufficient documentation

## 2011-12-15 DIAGNOSIS — R739 Hyperglycemia, unspecified: Secondary | ICD-10-CM

## 2011-12-15 DIAGNOSIS — M549 Dorsalgia, unspecified: Secondary | ICD-10-CM | POA: Insufficient documentation

## 2011-12-15 DIAGNOSIS — E119 Type 2 diabetes mellitus without complications: Secondary | ICD-10-CM | POA: Insufficient documentation

## 2011-12-15 DIAGNOSIS — M79609 Pain in unspecified limb: Secondary | ICD-10-CM | POA: Insufficient documentation

## 2011-12-15 DIAGNOSIS — M4800 Spinal stenosis, site unspecified: Secondary | ICD-10-CM

## 2011-12-15 DIAGNOSIS — R262 Difficulty in walking, not elsewhere classified: Secondary | ICD-10-CM | POA: Insufficient documentation

## 2011-12-15 DIAGNOSIS — Z79899 Other long term (current) drug therapy: Secondary | ICD-10-CM | POA: Insufficient documentation

## 2011-12-15 DIAGNOSIS — I10 Essential (primary) hypertension: Secondary | ICD-10-CM | POA: Insufficient documentation

## 2011-12-15 LAB — URINALYSIS, ROUTINE W REFLEX MICROSCOPIC
Bilirubin Urine: NEGATIVE
Glucose, UA: >1000 mg/dL — AB
Leukocytes, UA: NEGATIVE
Nitrite: NEGATIVE
Specific Gravity, Urine: 1.021 (ref 1.005–1.030)
pH: 5 (ref 5.0–8.0)

## 2011-12-15 LAB — POCT I-STAT, CHEM 8
Calcium, Ion: 1.22 mmol/L (ref 1.12–1.32)
Chloride: 103 mEq/L (ref 96–112)
HCT: 34 % — ABNORMAL LOW (ref 36.0–46.0)
Sodium: 137 mEq/L (ref 135–145)
TCO2: 25 mmol/L (ref 0–100)

## 2011-12-15 LAB — GLUCOSE, CAPILLARY: Glucose-Capillary: 285 mg/dL — ABNORMAL HIGH (ref 70–99)

## 2011-12-15 LAB — URINE MICROSCOPIC-ADD ON

## 2011-12-15 MED ORDER — OXYCODONE-ACETAMINOPHEN 5-325 MG PO TABS
1.0000 | ORAL_TABLET | Freq: Once | ORAL | Status: AC
  Administered 2011-12-15: 1 via ORAL

## 2011-12-15 MED ORDER — METHYLPREDNISOLONE 4 MG PO KIT: PACK | ORAL | Status: DC

## 2011-12-15 MED ORDER — OXYCODONE-ACETAMINOPHEN 10-325 MG PO TABS: 1.0000 | ORAL_TABLET | Freq: Four times a day (QID) | ORAL | Status: DC | PRN

## 2011-12-15 MED ORDER — INSULIN ASPART 100 UNIT/ML ~~LOC~~ SOLN
6.0000 [IU] | Freq: Once | SUBCUTANEOUS | Status: AC
  Administered 2011-12-15: 6 [IU] via SUBCUTANEOUS
  Filled 2011-12-15 (×3): qty 1

## 2011-12-15 MED ORDER — IBUPROFEN 200 MG PO TABS
400.0000 mg | ORAL_TABLET | Freq: Once | ORAL | Status: AC
  Administered 2011-12-15: 400 mg via ORAL
  Filled 2011-12-15: qty 2

## 2011-12-15 MED ORDER — OXYCODONE-ACETAMINOPHEN 5-325 MG PO TABS
ORAL_TABLET | ORAL | Status: AC
  Filled 2011-12-15: qty 2

## 2011-12-15 MED ORDER — CYCLOBENZAPRINE HCL 10 MG PO TABS: 10.0000 mg | ORAL_TABLET | Freq: Three times a day (TID) | ORAL | Status: AC | PRN

## 2011-12-15 MED ORDER — ALPRAZOLAM 1 MG PO TABS: 1.0000 mg | ORAL_TABLET | Freq: Every day | ORAL | Status: DC

## 2011-12-15 NOTE — ED Notes (Signed)
Case Management at bedside.

## 2011-12-15 NOTE — ED Notes (Signed)
Ortho tech notified re: lumbar corsett

## 2011-12-15 NOTE — ED Notes (Signed)
D/C home pending lumbar corsett

## 2011-12-15 NOTE — ED Notes (Signed)
[facility] tech at bedside placing lumbar brace and providing education to patient regarding the same

## 2011-12-15 NOTE — ED Notes (Signed)
Patient transported to MRI 

## 2011-12-15 NOTE — ED Provider Notes (Signed)
Medical screening examination/treatment/procedure(s) were conducted as a shared visit with non-physician practitioner(s) and myself.  I personally evaluated the patient during the encounter   Gwyneth Sprout, MD 12/15/11 2142

## 2011-12-15 NOTE — ED Provider Notes (Addendum)
History   This chart was scribed for Krista Sprout, MD by Brooks Sailors. The patient was seen in room STRE3/STRE3. Patient's care was started at 1019.   CSN: [id number]  Arrival date & time 12/15/11  1019   First MD Initiated Contact with Patient 12/15/11 1101      Chief Complaint  Patient presents with  . Back Pain  . Leg Pain    (Consider location/radiation/quality/duration/timing/severity/associated sxs/prior treatment) Patient is a 78 y.o. female presenting with back pain. The history is provided by the patient.  Back Pain  Associated symptoms include leg pain and weakness. Pertinent negatives include no fever, no numbness, no perianal numbness and no dysuria.    Krista Clayton is a 78 y.o. female who presents to the Emergency Department complaining of lower extremity and lower back pain onset on month ago with associated weakness. Pt says the pain is worse when ambulating. Pt last saw PCP for back pain 10/29/11 and was told to use back brace. Nothing makes the pain better. Patient has been on Percocet and Aleve 3x a day without significant relief but notes constipation and increased frequency. Denies fever, dysuria.     Past Medical History  Diagnosis Date  . Hyperlipidemia   . Diabetes mellitus   . Hypertension   . IHD (ischemic heart disease)   . Anginal pain   . Hypothyroidism   . Chronic back pain   . Iron deficiency anemia   . Heart murmur     Past Surgical History  Procedure Date  . Coronary angioplasty 12/23/96    CARDIAC SIZE AND SILHOUETTE WERE NORMAL. THERE WAS INFERIOR HYPOKINESIS EF 45%  . Transthoracic echocardiogram 08/2006    SHOWED MITRAL REGURGITATION  . Appendectomy     Family History  Problem Relation Age of Onset  . Heart disease Father   . Heart attack Father   . Stroke Brother   . Cancer Brother     History  Substance Use Topics  . Smoking status: Former Smoker    Quit date: 12/17/1980  . Smokeless tobacco: Not on file  .  Alcohol Use: No    OB History    Grav Para Term Preterm Abortions TAB SAB Ect Mult Living                  Review of Systems  Constitutional: Negative for fever.  Genitourinary: Negative for dysuria.  Musculoskeletal: Positive for back pain.  Neurological: Positive for weakness. Negative for numbness.  All other systems reviewed and are negative.    Allergies  Gabapentin and Zestril  Home Medications   Current Outpatient Rx  Name Route Sig Dispense Refill  . ALPRAZOLAM 1 MG PO TABS Oral Take 1 tablet (1 mg total) by mouth at bedtime. 30 tablet 0  . ASPIRIN 325 MG PO TABS Oral Take 325 mg by mouth daily.      . ATORVASTATIN CALCIUM 80 MG PO TABS Oral Take 1 tablet (80 mg total) by mouth daily. 90 tablet 3  . IRON 325 (65 FE) MG PO TABS Oral Take by mouth daily.      Marland Kitchen FOLIC ACID 1 MG PO TABS Oral Take 1 tablet (1 mg total) by mouth daily. 90 tablet 3  . FUROSEMIDE 40 MG PO TABS Oral Take 1 tablet (40 mg total) by mouth daily. 90 tablet 3  . GLIMEPIRIDE 4 MG PO TABS  TAKE 1 TABLET BY MOUTH EVERY DAY 90 tablet 4  . ISOSORBIDE MONONITRATE ER 120  MG PO TB24  TAKE 1 TABLET BY MOUTH EVERY DAY 90 tablet 4  . LEVOTHYROXINE SODIUM 50 MCG PO TABS  TAKE 1 TABLET (50 MCG TOTAL) BY MOUTH AS DIRECTED. 30 tablet 0    Patient needs to schedule office visit  . LOSARTAN POTASSIUM 100 MG PO TABS Oral Take 1 tablet (100 mg total) by mouth daily. 90 tablet 3  . METOPROLOL TARTRATE 50 MG PO TABS  TAKE 1 TABLET BY MOUTH TWICE A DAY 180 tablet 3  . ONE-DAILY MULTI VITAMINS PO TABS Oral Take 1 tablet by mouth daily.      . [facility] SPECTRAVITE ADVANCED PO Oral Take 1 tablet by mouth daily.     . ALEVE PO Oral Take 220 mg by mouth 3 (three) times daily.     Marland Kitchen NITROGLYCERIN 0.4 MG SL SUBL Sublingual Place 0.4 mg under the tongue every 5 (five) minutes as needed. For chest pain    . OXYCODONE-ACETAMINOPHEN 10-325 MG PO TABS Oral Take 1 tablet by mouth every 6 (six) hours as needed. For pain      BP 152/70   Pulse 59  Temp(Src) 97.9 F (36.6 C) (Oral)  Resp 14  Ht 5\' 3"  (1.6 m)  Wt 150 lb (68.04 kg)  BMI 26.57 kg/m2  SpO2 99%  Physical Exam  Nursing note and vitals reviewed. Constitutional: She is oriented to person, place, and time. She appears well-developed and well-nourished. No distress.  HENT:  Head: Normocephalic and atraumatic.  Eyes: EOM are normal. Pupils are equal, round, and reactive to light.  Neck: Neck supple. No tracheal deviation present.  Cardiovascular: Normal rate and intact distal pulses.        Strong DP and PT pulses   Pulmonary/Chest: Effort normal. No respiratory distress. She has no wheezes. She has no rales.  Abdominal: Soft. She exhibits no distension. There is no tenderness. There is no rebound and no guarding.  Musculoskeletal: Normal range of motion. She exhibits tenderness. She exhibits no edema.       No swelling of the lower extremities. Mild, lower, lumbar tenderness.   Neurological: She is alert and oriented to person, place, and time. She has normal strength. No sensory deficit.  Skin: Skin is warm and dry.  Psychiatric: She has a normal mood and affect. Her behavior is normal.    ED Course  Procedures (including critical care time)  1106 pt seen and agrees with course of care to have UA, blood work and MRI. Pt encouraged to see spine specialist in future.    Labs Reviewed - No data to display No results found.   No diagnosis found.    MDM   Pt with gradual onset of back pain suggestive of radiculopathy that has been worsening over the last 1-2 months.  No neurovascular compromise and no incontinence. But states now she is having difficulty getting out of bed due to the pain. She states she can only walk short distances in the pain comes on so badly she's afraid she will fall. In the last 7 months she's had 2 falls and states that the pain has progressively worsened after that.   Pt has no infectious sx, hx of CA  or other red flags  concerning for pathologic back pain.  Normal strength and reflexes on exam.  Last MR of the spine was done 3 years ago the patient states her symptoms were very severe now. She was seeing a specialist but he within the last 2 months he  has moved and she has not been able to see anybody new. She's been taking Percocet and Aleve 3 times a day for the last 2 months and is not helping.  Will get MR, i-STAT due to frequent NSAID use, UA.    I personally performed the services described in this documentation, which was scribed in my presence.  The recorded information has been reviewed and considered.     Krista Sprout, MD 12/15/11 1130  Krista Sprout, MD 12/15/11 1131

## 2011-12-15 NOTE — Discharge Instructions (Signed)
Ms Orrick the MRI today does show worsening spinal stenosis. I spoke with Dr. Wynetta Emery he would be glad to followup with you or you can go see Dr. Channing Mutters in [place] where he works now.  I don't think they will do a surgical procedure on you to correct this problem.  Take ibuprofen 600mg  for pain every 6 hours as needed for pain.   Take the percocet for pain as needed every 4 hours but do not be walking around the house with this medication because it is a fall risk.  The home health nurse will come by to help you with your medications and show you how to check your sugars. Your initial sugar in the ER today was 444. We gave you insulin for this and the numbers came down to the high 200s.  Hyperglycemia Hyperglycemia occurs when the glucose (sugar) in your blood is too high. Hyperglycemia can happen for many reasons, but it most often happens to people who do not know they have diabetes or are not managing their diabetes properly.  CAUSES  Whether you have diabetes or not, there are other causes of hyperglycemia. Hyperglycemia can occur when you have diabetes, but it can also occur in other situations that you might not be as aware of, such as: Diabetes  If you have diabetes and are having problems controlling your blood glucose, hyperglycemia could occur because of some of the following reasons:   Not following your meal plan.   Not taking your diabetes medications or not taking it properly.   Exercising less or doing less activity than you normally do.   Being sick.  Pre-diabetes  This cannot be ignored. Before people develop Type 2 diabetes, they almost always have "pre-diabetes." This is when your blood glucose levels are higher than normal, but not yet high enough to be diagnosed as diabetes. Research has shown that some long-term damage to the body, especially the heart and circulatory system, may already be occurring during pre-diabetes. If you take action to manage your blood glucose when you have  pre-diabetes, you may delay or prevent Type 2 diabetes from developing.  Stress  If you have diabetes, you may be "diet" controlled or on oral medications or insulin to control your diabetes. However, you may find that your blood glucose is higher than usual in the hospital whether you have diabetes or not. This is often referred to as "stress hyperglycemia." Stress can elevate your blood glucose. This happens because of hormones put out by the body during times of stress. If stress has been the cause of your high blood glucose, it can be followed regularly by your caregiver. That way he/she can make sure your hyperglycemia does not continue to get worse or progress to diabetes.  Steroids  Steroids are medications that act on the infection fighting system (immune system) to block inflammation or infection. One side effect can be a rise in blood glucose. Most people can produce enough extra insulin to allow for this rise, but for those who cannot, steroids make blood glucose levels go even higher. It is not unusual for steroid treatments to "uncover" diabetes that is developing. It is not always possible to determine if the hyperglycemia will go away after the steroids are stopped. A special blood test called an A1c is sometimes done to determine if your blood glucose was elevated before the steroids were started.  SYMPTOMS  Thirsty.   Frequent urination.   Dry mouth.   Blurred vision.  Tired or fatigue.   Weakness.   Sleepy.   Tingling in feet or leg.  DIAGNOSIS  Diagnosis is made by monitoring blood glucose in one or all of the following ways:  A1c test. This is a chemical found in your blood.   Fingerstick blood glucose monitoring.   Laboratory results.  TREATMENT  First, knowing the cause of the hyperglycemia is important before the hyperglycemia can be treated. Treatment may include, but is not be limited to:  Education.   Change or adjustment in medications.   Change or  adjustment in meal plan.   Treatment for an illness, infection, etc.   More frequent blood glucose monitoring.   Change in exercise plan.   Decreasing or stopping steroids.   Lifestyle changes.  HOME CARE INSTRUCTIONS   Test your blood glucose as directed.   Exercise regularly. Your caregiver will give you instructions about exercise. Pre-diabetes or diabetes which comes on with stress is helped by exercising.   Eat wholesome, balanced meals. Eat often and at regular, fixed times. Your caregiver or nutritionist will give you a meal plan to guide your sugar intake.   Being at an ideal weight is important. If needed, losing as little as 10 to 15 pounds may help improve blood glucose levels.  SEEK MEDICAL CARE IF:   You have questions about medicine, activity, or diet.   You continue to have symptoms (problems such as increased thirst, urination, or weight gain).  SEEK IMMEDIATE MEDICAL CARE IF:   You are vomiting or have diarrhea.   Your breath smells fruity.   You are breathing faster or slower.   You are very sleepy or incoherent.   You have numbness, tingling, or pain in your feet or hands.   You have chest pain.   Your symptoms get worse even though you have been following your caregiver's orders.   If you have any other questions or concerns.  Document Released: 12/31/2000 Document Revised: 06/26/2011 Document Reviewed: 02/26/2009 [facility] Patient Information 2012 [place], [place].Hyperglycemia Hyperglycemia occurs when the glucose (sugar) in your blood is too high. Hyperglycemia can happen for many reasons, but it most often happens to people who do not know they have diabetes or are not managing their diabetes properly.  CAUSES  Whether you have diabetes or not, there are other causes of hyperglycemia. Hyperglycemia can occur when you have diabetes, but it can also occur in other situations that you might not be as aware of, such as: Diabetes  If you have diabetes  and are having problems controlling your blood glucose, hyperglycemia could occur because of some of the following reasons:   Not following your meal plan.   Not taking your diabetes medications or not taking it properly.   Exercising less or doing less activity than you normally do.   Being sick.  Pre-diabetes  This cannot be ignored. Before people develop Type 2 diabetes, they almost always have "pre-diabetes." This is when your blood glucose levels are higher than normal, but not yet high enough to be diagnosed as diabetes. Research has shown that some long-term damage to the body, especially the heart and circulatory system, may already be occurring during pre-diabetes. If you take action to manage your blood glucose when you have pre-diabetes, you may delay or prevent Type 2 diabetes from developing.  Stress  If you have diabetes, you may be "diet" controlled or on oral medications or insulin to control your diabetes. However, you may find that your blood glucose  is higher than usual in the hospital whether you have diabetes or not. This is often referred to as "stress hyperglycemia." Stress can elevate your blood glucose. This happens because of hormones put out by the body during times of stress. If stress has been the cause of your high blood glucose, it can be followed regularly by your caregiver. That way he/she can make sure your hyperglycemia does not continue to get worse or progress to diabetes.  Steroids  Steroids are medications that act on the infection fighting system (immune system) to block inflammation or infection. One side effect can be a rise in blood glucose. Most people can produce enough extra insulin to allow for this rise, but for those who cannot, steroids make blood glucose levels go even higher. It is not unusual for steroid treatments to "uncover" diabetes that is developing. It is not always possible to determine if the hyperglycemia will go away after the steroids  are stopped. A special blood test called an A1c is sometimes done to determine if your blood glucose was elevated before the steroids were started.  SYMPTOMS  Thirsty.   Frequent urination.   Dry mouth.   Blurred vision.   Tired or fatigue.   Weakness.   Sleepy.   Tingling in feet or leg.  DIAGNOSIS  Diagnosis is made by monitoring blood glucose in one or all of the following ways:  A1c test. This is a chemical found in your blood.   Fingerstick blood glucose monitoring.   Laboratory results.  TREATMENT  First, knowing the cause of the hyperglycemia is important before the hyperglycemia can be treated. Treatment may include, but is not be limited to:  Education.   Change or adjustment in medications.   Change or adjustment in meal plan.   Treatment for an illness, infection, etc.   More frequent blood glucose monitoring.   Change in exercise plan.   Decreasing or stopping steroids.   Lifestyle changes.  HOME CARE INSTRUCTIONS   Test your blood glucose as directed.   Exercise regularly. Your caregiver will give you instructions about exercise. Pre-diabetes or diabetes which comes on with stress is helped by exercising.   Eat wholesome, balanced meals. Eat often and at regular, fixed times. Your caregiver or nutritionist will give you a meal plan to guide your sugar intake.   Being at an ideal weight is important. If needed, losing as little as 10 to 15 pounds may help improve blood glucose levels.  SEEK MEDICAL CARE IF:   You have questions about medicine, activity, or diet.   You continue to have symptoms (problems such as increased thirst, urination, or weight gain).  SEEK IMMEDIATE MEDICAL CARE IF:   You are vomiting or have diarrhea.   Your breath smells fruity.   You are breathing faster or slower.   You are very sleepy or incoherent.   You have numbness, tingling, or pain in your feet or hands.   You have chest pain.   Your symptoms get  worse even though you have been following your caregiver's orders.   If you have any other questions or concerns.  Document Released: 12/31/2000 Document Revised: 06/26/2011 Document Reviewed: 02/26/2009 [facility] Patient Information 2012 [place], [place].

## 2011-12-15 NOTE — ED Notes (Signed)
Contacted ortho tech re: eta for lumbar corsett, should be 5-10 minutes.

## 2011-12-15 NOTE — ED Notes (Signed)
Patient reports she has had back pain for years.  The pain has been worse for 2 mths.  She has pain in her lower back and in her legs bil.  Patient has been seen by her md,  She is on pain medications.  Patient is independent at home.  She does have someone to help her as needed.

## 2011-12-15 NOTE — Progress Notes (Signed)
Received referral for [facility] RN from ED for this pt. Pt lives alone has multiple medications with some confusion as to administration and was admitted to ED due to uncontrolled back pain , upon adm CBG was found to be > 400.  Met with pt and pt neighbor and support person re needs. Pt selected [facility] for [facility] medication management. Will also make a referral to [employer] for this pt per her request. PT has walker and wheelchair, resistant to using a brace for back support.  Some discussion of hh aide needs, and pt and neighbor given info re hh agencies that provide hh aide services for their review as pt needs include shopping, assist with light housework and getting out of her apartment for exercise.  Johny Shock RN MPH Case Manager [phone]

## 2011-12-15 NOTE — ED Notes (Signed)
Case management contacted re: home RN services

## 2011-12-15 NOTE — ED Provider Notes (Signed)
12:04 PM Report received from Dr. Anitra Lauth. Patient will be left to CDU to awaiting an MRI of her lumbar spine to evaluate her worsening lower extremity and back pain. Patient has a past medical history of spinal stenosis and needs a referral to a neurosurgeon.  2:56 PM Spoke with  Dr. Wynetta Emery and he states that patient is not a surgical canadate for the worsening spinal stenosis on the MRI today.  States that patient can follow up with Dr.  Channing Mutters in [place] or go to Dr. Wynetta Emery in [place].  Recommends a corsette, steroids  and muscle relaxor with percocet too.  Patient lives alone and uses a walker.  Neighbor checks on her frequently.  Social worker and case Occupational hygienist up in home nurse to go over meds and accu check machine.  Blood sugar elevated in er after drinking soda pta.  SQ insulin given to bring blood sugar down.  No ketoacidocis. Labs Reviewed  URINALYSIS, ROUTINE W REFLEX MICROSCOPIC - Abnormal; Notable for the following:    Glucose, UA >1000 (*)    All other components within normal limits  POCT I-STAT, CHEM 8 - Abnormal; Notable for the following:    BUN 25 (*)    Creatinine, Ser 1.30 (*)    Glucose, Bld 442 (*)    Hemoglobin 11.6 (*)    HCT 34.0 (*)    All other components within normal limits  GLUCOSE, CAPILLARY - Abnormal; Notable for the following:    Glucose-Capillary 285 (*)    All other components within normal limits  URINE MICROSCOPIC-ADD ON    Remi Haggard, NP 12/15/11 2047

## 2011-12-15 NOTE — Progress Notes (Signed)
Orthopedic Tech Progress Note Patient Details:  LILIANNE DELAIR 02/14/1929 [id number]  Patient ID: Roanna Epley, female   DOB: [birth year], 78 y.o.   MRN: [id number]   Shawnie Pons 12/15/2011, 3:06 PM Called bio tech

## 2011-12-18 ENCOUNTER — Telehealth: Payer: Self-pay | Admitting: Cardiology

## 2011-12-18 NOTE — Telephone Encounter (Signed)
New Problem:    Nurse called to request an order for occupational and physical therapy due to the balance issues that Ms. Krista Clayton is having.  Please call back.

## 2011-12-18 NOTE — Telephone Encounter (Signed)
Left message to call back  

## 2011-12-24 NOTE — Telephone Encounter (Signed)
Never heard back from [facility], patient should get orders from PCP

## 2011-12-29 ENCOUNTER — Other Ambulatory Visit: Payer: Self-pay | Admitting: Internal Medicine

## 2012-01-02 ENCOUNTER — Other Ambulatory Visit (HOSPITAL_COMMUNITY): Payer: Self-pay | Admitting: *Deleted

## 2012-01-02 DIAGNOSIS — G4700 Insomnia, unspecified: Secondary | ICD-10-CM

## 2012-01-02 NOTE — Telephone Encounter (Signed)
Refilled alprazolam 

## 2012-01-03 MED ORDER — ALPRAZOLAM 1 MG PO TABS: 1.0000 mg | ORAL_TABLET | Freq: Every day | ORAL | Status: AC

## 2012-01-05 ENCOUNTER — Telehealth: Payer: Self-pay | Admitting: Cardiology

## 2012-01-05 NOTE — Telephone Encounter (Signed)
Pt advised to call PCP for xanax and percocet per dr brackbill--pt agrees

## 2012-01-05 NOTE — Telephone Encounter (Signed)
Pt needs xanax refill @ [facility], pt wants an rx for percocet, although dr Patty Sermons didn't prescribe it in the first place, pls call [phone]

## 2012-01-06 ENCOUNTER — Other Ambulatory Visit: Payer: Self-pay | Admitting: *Deleted

## 2012-01-12 NOTE — Telephone Encounter (Signed)
Noted.  She should get those medications from her PCP

## 2012-01-12 NOTE — Telephone Encounter (Signed)
Will forward to  Dr. Brackbill for review 

## 2012-01-20 ENCOUNTER — Other Ambulatory Visit: Payer: Self-pay | Admitting: Neurosurgery

## 2012-01-20 DIAGNOSIS — M47812 Spondylosis without myelopathy or radiculopathy, cervical region: Secondary | ICD-10-CM

## 2012-01-28 ENCOUNTER — Ambulatory Visit
Admission: RE | Admit: 2012-01-28 | Discharge: 2012-01-28 | Disposition: A | Discharge status: Home / Self Care | Payer: Medicare Other | Source: Ambulatory Visit | Attending: Neurosurgery | Admitting: Neurosurgery

## 2012-01-28 DIAGNOSIS — M47812 Spondylosis without myelopathy or radiculopathy, cervical region: Secondary | ICD-10-CM

## 2012-02-18 ENCOUNTER — Other Ambulatory Visit: Payer: Self-pay | Admitting: Cardiology

## 2012-03-16 ENCOUNTER — Other Ambulatory Visit: Payer: Self-pay | Admitting: Cardiology

## 2012-03-24 ENCOUNTER — Other Ambulatory Visit: Payer: Self-pay | Admitting: Cardiology

## 2012-04-16 ENCOUNTER — Ambulatory Visit (INDEPENDENT_AMBULATORY_CARE_PROVIDER_SITE_OTHER): Payer: Medicare Other | Admitting: Cardiology

## 2012-04-16 ENCOUNTER — Encounter: Payer: Self-pay | Admitting: Cardiology

## 2012-04-16 ENCOUNTER — Other Ambulatory Visit (INDEPENDENT_AMBULATORY_CARE_PROVIDER_SITE_OTHER): Payer: Medicare Other

## 2012-04-16 VITALS — BP 138/78 | HR 66 | Ht 63.0 in | Wt 149.0 lb

## 2012-04-16 DIAGNOSIS — I2510 Atherosclerotic heart disease of native coronary artery without angina pectoris: Secondary | ICD-10-CM

## 2012-04-16 DIAGNOSIS — E785 Hyperlipidemia, unspecified: Secondary | ICD-10-CM

## 2012-04-16 DIAGNOSIS — E039 Hypothyroidism, unspecified: Secondary | ICD-10-CM

## 2012-04-16 DIAGNOSIS — E119 Type 2 diabetes mellitus without complications: Secondary | ICD-10-CM

## 2012-04-16 DIAGNOSIS — I119 Hypertensive heart disease without heart failure: Secondary | ICD-10-CM

## 2012-04-16 DIAGNOSIS — I10 Essential (primary) hypertension: Secondary | ICD-10-CM

## 2012-04-16 DIAGNOSIS — IMO0001 Reserved for inherently not codable concepts without codable children: Secondary | ICD-10-CM

## 2012-04-16 DIAGNOSIS — E7800 Pure hypercholesterolemia, unspecified: Secondary | ICD-10-CM

## 2012-04-16 LAB — BASIC METABOLIC PANEL
BUN: 20 mg/dL (ref 6–23)
CO2: 28 mEq/L (ref 19–32)
Calcium: 9.2 mg/dL (ref 8.4–10.5)
Creatinine, Ser: 1.2 mg/dL (ref 0.4–1.2)
Glucose, Bld: 130 mg/dL — ABNORMAL HIGH (ref 70–99)

## 2012-04-16 LAB — HEPATIC FUNCTION PANEL
ALT: 31 U/L (ref 0–35)
AST: 30 U/L (ref 0–37)
Alkaline Phosphatase: 38 U/L — ABNORMAL LOW (ref 39–117)
Total Bilirubin: 0.6 mg/dL (ref 0.3–1.2)

## 2012-04-16 LAB — TSH: TSH: 2.81 u[IU]/mL (ref 0.35–5.50)

## 2012-04-16 LAB — LIPID PANEL: Total CHOL/HDL Ratio: 3

## 2012-04-16 NOTE — Progress Notes (Signed)
Quick Note:  Please report to patient. The recent labs are stable. Continue same medication and careful diet. TGs are better. ______

## 2012-04-16 NOTE — Patient Instructions (Addendum)
Your physician recommends that you continue on your current medications as directed. Please refer to the Current Medication list given to you today.  Your physician wants you to follow-up in: 4 months with fasting labs (lp/bmet/hfp) You will receive a reminder letter in the mail two months in advance. If you don't receive a letter, please call our office to schedule the follow-up appointment.  

## 2012-04-16 NOTE — Assessment & Plan Note (Signed)
Patient has not been expressing any recent chest pain or angina

## 2012-04-16 NOTE — Assessment & Plan Note (Signed)
The patient denies any hypoglycemic episodes 

## 2012-04-16 NOTE — Assessment & Plan Note (Signed)
The patient is on Lipitor 80 mg daily for her hypercholesterolemia.  She's not having any myalgias.  Blood work is pending

## 2012-04-16 NOTE — Progress Notes (Signed)
Roanna Epley Date of Birth:  1929-03-23 [facility] [address] [place], [place]  [address] [phone]         Fax   [phone]  History of Present Illness: This pleasant 78 year old woman is seen for a scheduled followup office visit. She has been feeling well since last visit. She has a history of high blood pressure, hypercholesterolemia, and diabetes. She's had no new symptoms since last visit.  She is still under a lot of stress from her family situation at home.   Current Outpatient Prescriptions  Medication Sig Dispense Refill  . ALPRAZolam (XANAX) 1 MG tablet Take 1 tablet (1 mg total) by mouth at bedtime.  30 tablet  3  . aspirin 325 MG tablet Take 325 mg by mouth daily.        Marland Kitchen atorvastatin (LIPITOR) 80 MG tablet Take 1 tablet (80 mg total) by mouth daily.  90 tablet  3  . B-D ULTRAFINE III SHORT PEN 31G X 8 MM MISC as directed.      . cyclobenzaprine (FLEXERIL) 5 MG tablet as directed.      . Ferrous Sulfate (IRON) 325 (65 FE) MG TABS Take by mouth daily.        . folic acid (FOLVITE) 1 MG tablet TAKE 1 TABLET EVERY DAY  90 tablet  3  . furosemide (LASIX) 40 MG tablet Take 1 tablet (40 mg total) by mouth daily.  90 tablet  3  . glimepiride (AMARYL) 4 MG tablet TAKE 1 TABLET BY MOUTH EVERY DAY  90 tablet  3  . HYDROcodone-acetaminophen (NORCO/VICODIN) 5-325 MG per tablet as directed.      . isosorbide mononitrate (IMDUR) 120 MG 24 hr tablet TAKE 1 TABLET BY MOUTH EVERY DAY  90 tablet  4  . LANTUS SOLOSTAR 100 UNIT/ML injection as directed.      Marland Kitchen levothyroxine (SYNTHROID, LEVOTHROID) 50 MCG tablet TAKE 1 TABLET (50 MCG TOTAL) BY MOUTH AS DIRECTED.  30 tablet  0  . losartan (COZAAR) 100 MG tablet Take 1 tablet (100 mg total) by mouth daily.  90 tablet  3  . metoprolol (LOPRESSOR) 50 MG tablet TAKE 1 TABLET BY MOUTH TWICE A DAY  180 tablet  3  . Multiple Vitamin (MULTIVITAMIN) tablet Take 1 tablet by mouth daily.        . Multiple  Vitamins-Minerals ([facility] SPECTRAVITE ADVANCED PO) Take 1 tablet by mouth daily.       . Naproxen Sodium (ALEVE PO) Take 220 mg by mouth 3 (three) times daily.       . nitroGLYCERIN (NITROSTAT) 0.4 MG SL tablet Place 0.4 mg under the tongue every 5 (five) minutes as needed. For chest pain      . oxyCODONE-acetaminophen (PERCOCET) 10-325 MG per tablet Take 1 tablet by mouth every 6 (six) hours as needed. For pain  15 tablet  0    Allergies  Allergen Reactions  . Gabapentin     disorientation  . Zestril (Lisinopril) Cough    Patient Active Problem List  Diagnosis  . HYPOTHYROIDISM  . DIABETES MELLITUS, TYPE II  . UNSPECIFIED VITAMIN D DEFICIENCY  . HYPERLIPIDEMIA  . ANEMIA-NOS  . DEMENTIA  . ANXIETY  . DEPRESSION  . HYPERTENSION  . MYOCARDIAL INFARCTION, HX OF  . CORONARY ARTERY DISEASE  . MENOPAUSAL DISORDER  . OSTEOARTHRITIS, SHOULDER, RIGHT  . DISC DISEASE, LUMBAR  . SPINAL STENOSIS, LUMBAR  . BACK PAIN, CHRONIC  . FATIGUE  . MEMORY LOSS  .  BREAST CANCER, HX OF  . OSTEOPOROSIS    History  Smoking status  . Former Smoker  . Quit date: 12/17/1980  Smokeless tobacco  . Not on file    History  Alcohol Use No    Family History  Problem Relation Age of Onset  . Heart disease Father   . Heart attack Father   . Stroke Brother   . Cancer Brother     Review of Systems: Constitutional: no fever chills diaphoresis or fatigue or change in weight.  Head and neck: no hearing loss, no epistaxis, no photophobia or visual disturbance. Respiratory: No cough, shortness of breath or wheezing. Cardiovascular: No chest pain peripheral edema, palpitations. Gastrointestinal: No abdominal distention, no abdominal pain, no change in bowel habits hematochezia or melena. Genitourinary: No dysuria, no frequency, no urgency, no nocturia. Musculoskeletal:No arthralgias, no back pain, no gait disturbance or myalgias. Neurological: No dizziness, no headaches, no numbness, no seizures, no  syncope, no weakness, no tremors. Hematologic: No lymphadenopathy, no easy bruising. Psychiatric: No confusion, no hallucinations, no sleep disturbance.    Physical Exam: Filed Vitals:   04/16/12 0957  BP: 138/78  Pulse: 66   the general appearance reveals an elderly woman in no acute distress.  She ambulates with the help of a rolling walker with a seat.The head and neck exam reveals pupils equal and reactive.  Extraocular movements are full.  There is no scleral icterus.  The mouth and pharynx are normal.  The neck is supple.  The carotids reveal no bruits.  The jugular venous pressure is normal.  The  thyroid is not enlarged.  There is no lymphadenopathy.  The chest is clear to percussion and auscultation.  There are no rales or rhonchi.  Expansion of the chest is symmetrical.  The precordium is quiet.  The first heart sound is normal.  The second heart sound is physiologically split.  There is no murmur gallop rub or click.  There is no abnormal lift or heave.  The abdomen is soft and nontender.  The bowel sounds are normal.  The liver and spleen are not enlarged.  There are no abdominal masses.  There are no abdominal bruits.  Extremities reveal fair pedal pulses.  There is no phlebitis or edema.  There is no cyanosis or clubbing.  Strength is normal and symmetrical in all extremities.  There is no lateralizing weakness.  There are no sensory deficits.  The skin is warm and dry.  There is no rash.    Assessment / Plan: Continue same medication.  Recheck in 4 months for followup office visit and fasting lab work.  Blood work today pending

## 2012-04-19 ENCOUNTER — Telehealth: Payer: Self-pay | Admitting: *Deleted

## 2012-04-19 NOTE — Telephone Encounter (Signed)
Message copied by Burnell Blanks on Mon Apr 19, 2012  6:06 PM ------      Message from: Cassell Clement      Created: Fri Apr 16, 2012  2:45 PM       Please report to patient.  The recent labs are stable. Continue same medication and careful diet. TGs are better.

## 2012-04-19 NOTE — Telephone Encounter (Signed)
Advised patient of lab results   Look for handicap form

## 2012-08-13 ENCOUNTER — Other Ambulatory Visit: Payer: Self-pay

## 2012-08-13 MED ORDER — LOSARTAN POTASSIUM 100 MG PO TABS: 100.0000 mg | ORAL_TABLET | Freq: Every day | ORAL | Status: DC

## 2012-08-16 ENCOUNTER — Other Ambulatory Visit: Payer: Self-pay

## 2012-08-16 MED ORDER — LOSARTAN POTASSIUM 100 MG PO TABS: 100.0000 mg | ORAL_TABLET | Freq: Every day | ORAL | Status: AC

## 2012-09-09 ENCOUNTER — Other Ambulatory Visit: Payer: Self-pay | Admitting: *Deleted

## 2012-09-09 MED ORDER — FUROSEMIDE 40 MG PO TABS: 40.0000 mg | ORAL_TABLET | Freq: Every day | ORAL | Status: DC

## 2012-10-12 ENCOUNTER — Telehealth: Payer: Self-pay | Admitting: Cardiology

## 2012-10-13 ENCOUNTER — Ambulatory Visit (INDEPENDENT_AMBULATORY_CARE_PROVIDER_SITE_OTHER): Payer: Medicare Other | Admitting: Cardiology

## 2012-10-13 ENCOUNTER — Encounter: Payer: Self-pay | Admitting: Cardiology

## 2012-10-13 VITALS — BP 146/69 | HR 66 | Ht 63.0 in | Wt 153.0 lb

## 2012-10-13 DIAGNOSIS — I259 Chronic ischemic heart disease, unspecified: Secondary | ICD-10-CM

## 2012-10-13 DIAGNOSIS — I2510 Atherosclerotic heart disease of native coronary artery without angina pectoris: Secondary | ICD-10-CM

## 2012-10-13 DIAGNOSIS — R42 Dizziness and giddiness: Secondary | ICD-10-CM

## 2012-10-13 DIAGNOSIS — K921 Melena: Secondary | ICD-10-CM | POA: Insufficient documentation

## 2012-10-13 DIAGNOSIS — B354 Tinea corporis: Secondary | ICD-10-CM | POA: Insufficient documentation

## 2012-10-13 DIAGNOSIS — Z79899 Other long term (current) drug therapy: Secondary | ICD-10-CM

## 2012-10-13 DIAGNOSIS — I10 Essential (primary) hypertension: Secondary | ICD-10-CM

## 2012-10-13 DIAGNOSIS — I119 Hypertensive heart disease without heart failure: Secondary | ICD-10-CM

## 2012-10-13 LAB — HEPATIC FUNCTION PANEL
ALT: 24 U/L (ref 0–35)
AST: 29 U/L (ref 0–37)
Alkaline Phosphatase: 69 U/L (ref 39–117)
Bilirubin, Direct: 0.1 mg/dL (ref 0.0–0.3)
Total Bilirubin: 0.4 mg/dL (ref 0.3–1.2)
Total Protein: 6.9 g/dL (ref 6.0–8.3)

## 2012-10-13 LAB — BASIC METABOLIC PANEL
BUN: 20 mg/dL (ref 6–23)
Calcium: 9.4 mg/dL (ref 8.4–10.5)
Creatinine, Ser: 1.3 mg/dL — ABNORMAL HIGH (ref 0.4–1.2)
GFR: 43.66 mL/min — ABNORMAL LOW (ref >60.00)
Glucose, Bld: 278 mg/dL — ABNORMAL HIGH (ref 70–99)

## 2012-10-13 LAB — LIPID PANEL
Cholesterol: 126 mg/dL (ref 0–200)
VLDL: 38.2 mg/dL (ref 0.0–40.0)

## 2012-10-13 LAB — CBC WITH DIFFERENTIAL/PLATELET
Basophils Absolute: 0 10*3/uL (ref 0.0–0.1)
Eosinophils Absolute: 0.2 10*3/uL (ref 0.0–0.7)
HCT: 29.1 % — ABNORMAL LOW (ref 36.0–46.0)
Hemoglobin: 9.9 g/dL — ABNORMAL LOW (ref 12.0–15.0)
Lymphs Abs: 1.7 10*3/uL (ref 0.7–4.0)
MCHC: 33.8 g/dL (ref 30.0–36.0)
Neutro Abs: 4.5 10*3/uL (ref 1.4–7.7)
RDW: 13.2 % (ref 11.5–14.6)

## 2012-10-13 MED ORDER — MECLIZINE HCL 25 MG PO TABS: 25.0000 mg | ORAL_TABLET | Freq: Three times a day (TID) | ORAL | Status: DC | PRN

## 2012-10-13 MED ORDER — NYSTATIN-TRIAMCINOLONE 100000-0.1 UNIT/GM-% EX CREA: TOPICAL_CREAM | Freq: Two times a day (BID) | CUTANEOUS | Status: AC | PRN

## 2012-10-13 NOTE — Assessment & Plan Note (Signed)
The patient has noted occasional bright red blood on the toilet tissue paper.  We obtained a CBC today and she does have a slight drop in hemoglobin since last visit.  She will try some Anusol rectal cream or some over-the-counter preparation H. and if symptoms continue she will contact her PCP for further workup.  She states that she does not want to have a colonoscopy but she might need one if symptoms persist.

## 2012-10-13 NOTE — Progress Notes (Signed)
Krista Clayton Date of Birth:  September 21, 1928 [facility] [address] [place], [place]  [address] [phone]         Fax   [phone]  History of Present Illness: This pleasant 79 year old woman is seen for a scheduled followup office visit. She has been feeling well since last visit. She has a history of high blood pressure, hypercholesterolemia, and diabetes. She's had no new symptoms since last visit. She is still under a lot of stress from her family situation at home.   Current Outpatient Prescriptions  Medication Sig Dispense Refill  . ALPRAZolam (XANAX) 1 MG tablet Take 1 tablet (1 mg total) by mouth at bedtime.  30 tablet  3  . aspirin 325 MG tablet Take 325 mg by mouth daily.        Marland Kitchen atorvastatin (LIPITOR) 80 MG tablet Take 1 tablet (80 mg total) by mouth daily.  90 tablet  3  . B-D ULTRAFINE III SHORT PEN 31G X 8 MM MISC as directed.      . cyclobenzaprine (FLEXERIL) 5 MG tablet as directed.      . Ferrous Sulfate (IRON) 325 (65 FE) MG TABS Take by mouth daily.        . folic acid (FOLVITE) 1 MG tablet TAKE 1 TABLET EVERY DAY  90 tablet  3  . furosemide (LASIX) 40 MG tablet Take 1 tablet (40 mg total) by mouth daily.  90 tablet  0  . glimepiride (AMARYL) 4 MG tablet TAKE 1 TABLET BY MOUTH EVERY DAY  90 tablet  3  . HYDROcodone-acetaminophen (NORCO/VICODIN) 5-325 MG per tablet as directed.      . isosorbide mononitrate (IMDUR) 120 MG 24 hr tablet TAKE 1 TABLET BY MOUTH EVERY DAY  90 tablet  4  . LANTUS SOLOSTAR 100 UNIT/ML injection as directed.      Marland Kitchen levothyroxine (SYNTHROID, LEVOTHROID) 50 MCG tablet TAKE 1 TABLET (50 MCG TOTAL) BY MOUTH AS DIRECTED.  30 tablet  0  . losartan (COZAAR) 100 MG tablet Take 1 tablet (100 mg total) by mouth daily.  90 tablet  3  . metoprolol (LOPRESSOR) 50 MG tablet TAKE 1 TABLET BY MOUTH TWICE A DAY  180 tablet  3  . Multiple Vitamin (MULTIVITAMIN) tablet Take 1 tablet by mouth daily.        . Multiple Vitamins-Minerals  ([facility]S SPECTRAVITE ADVANCED PO) Take 1 tablet by mouth daily.       . nitroGLYCERIN (NITROSTAT) 0.4 MG SL tablet Place 0.4 mg under the tongue every 5 (five) minutes as needed. For chest pain      . meclizine (ANTIVERT) 25 MG tablet Take 1 tablet (25 mg total) by mouth 3 (three) times daily as needed.  30 tablet  0  . nystatin-triamcinolone (MYCOLOG II) cream Apply topically 2 (two) times daily as needed.  60 g  0   No current facility-administered medications for this visit.    Allergies  Allergen Reactions  . Gabapentin     disorientation  . Zestril (Lisinopril) Cough    Patient Active Problem List  Diagnosis  . HYPOTHYROIDISM  . DIABETES MELLITUS, TYPE II  . UNSPECIFIED VITAMIN D DEFICIENCY  . HYPERLIPIDEMIA  . ANEMIA-NOS  . DEMENTIA  . ANXIETY  . DEPRESSION  . HYPERTENSION  . MYOCARDIAL INFARCTION, HX OF  . CORONARY ARTERY DISEASE  . MENOPAUSAL DISORDER  . OSTEOARTHRITIS, SHOULDER, RIGHT  . DISC DISEASE, LUMBAR  . SPINAL STENOSIS, LUMBAR  . BACK PAIN, CHRONIC  .  FATIGUE  . MEMORY LOSS  . BREAST CANCER, HX OF  . OSTEOPOROSIS  . Vertigo  . Tinea corporis    History  Smoking status  . Former Smoker  . Quit date: 12/17/1980  Smokeless tobacco  . Not on file    History  Alcohol Use No    Family History  Problem Relation Age of Onset  . Heart disease Father   . Heart attack Father   . Stroke Brother   . Cancer Brother     Review of Systems: Constitutional: no fever chills diaphoresis or fatigue or change in weight.  Head and neck: no hearing loss, no epistaxis, no photophobia or visual disturbance. Respiratory: No cough, shortness of breath or wheezing. Cardiovascular: No chest pain peripheral edema, palpitations. Gastrointestinal: No abdominal distention, no abdominal pain, no change in bowel habits hematochezia or melena. Genitourinary: No dysuria, no frequency, no urgency, no nocturia. Musculoskeletal:No arthralgias, no back pain, no gait disturbance  or myalgias. Neurological: No dizziness, no headaches, no numbness, no seizures, no syncope, no weakness, no tremors. Hematologic: No lymphadenopathy, no easy bruising. Psychiatric: No confusion, no hallucinations, no sleep disturbance.    Physical Exam: Filed Vitals:   10/13/12 1022  BP: 146/69  Pulse: 66   the general appearance reveals a well-developed well-nourished pleasant elderly woman in no distress.The head and neck exam reveals pupils equal and reactive.  Extraocular movements are full.  There is no scleral icterus.  The mouth and pharynx are normal.  The neck is supple.  The carotids reveal no bruits.  The jugular venous pressure is normal.  The  thyroid is not enlarged.  There is no lymphadenopathy.  The chest is clear to percussion and auscultation.  There are no rales or rhonchi.  Expansion of the chest is symmetrical.  The precordium is quiet.  The first heart sound is normal.  The second heart sound is physiologically split.  There is no murmur gallop rub or click.  There is no abnormal lift or heave.  The abdomen is soft and nontender.  The bowel sounds are normal.  The liver and spleen are not enlarged.  There are no abdominal masses.  There are no abdominal bruits.  Extremities reveal good pedal pulses.  There is no phlebitis or edema.  There is no cyanosis or clubbing.  Strength is normal and symmetrical in all extremities.  There is no lateralizing weakness.  There are no sensory deficits.  The skin is warm and dry.  There is no rash.    Assessment / Plan: Continue same medication.  She will and meclizine 25 mg every 6 hours when necessary for vertigo.  She will use Mycolog cream on the dermatitis on her trunk and under her breasts. If hematochezia persists she will contact her PCP for further evaluation and possible GI referral.  Her hemoglobin today is 9.9. Recheck here in 4 months for followup office visit and lab work

## 2012-10-13 NOTE — Patient Instructions (Addendum)
Will obtain labs today and call you with the results (lp/bmet/hfp/cbc)  Rx for Meclizine for your dizziness and Mycolog to apply under your breast as needed [facility]  Your physician wants you to follow-up in: 4 months with fasting labs (lp/bmet/hfp/cbc)  You will receive a reminder letter in the mail two months in advance. If you don't receive a letter, please call our office to schedule the follow-up appointment.

## 2012-10-13 NOTE — Assessment & Plan Note (Signed)
The patient has a tinea corporis yeast dermatitis under her breasts which is bothering her.  We will add Mycolog cream to be applied twice a day when necessary

## 2012-10-13 NOTE — Assessment & Plan Note (Signed)
The patient has not been having any symptoms of congestive heart failure.  She has had some problems with vertigo yesterday and today and we will add meclizine 25 mg every 6 hours when necessary to her regimen.

## 2012-10-13 NOTE — Assessment & Plan Note (Signed)
The patient has not been expressing any chest pain to suggest angina pectoris.

## 2012-10-14 NOTE — Progress Notes (Signed)
Quick Note:  Please report to patient. The recent labs are stable. Continue same medication and careful diet. Blood sugar and triglycerides are higher. Watch diet carefully. Followup for diabetes with PCP. The hemoglobin is 9.9 which is down slightly from previous results. Followup regarding occasional hematochezia with PCP. ______

## 2012-10-20 ENCOUNTER — Telehealth: Payer: Self-pay | Admitting: Cardiology

## 2012-10-20 NOTE — Telephone Encounter (Signed)
[facility] Office Manager Called stating Her Environmental health practitioner has been calling for 3 Days Trying to Get LOV on this Patient, I let her Know the 1st phone call I received was on 4/1 from [place] and I faxed the OV note from 10/13/12 over to [place] it Did not Go through And I called Left Message with [employer] Staff asking them to [employer] letting her Know it did not go through and for her to call me back I did Not receive  A call back until this Morning From Electronics engineer. I refaxed The OV Note From 3/26 To [facility] at [phone] x2 it Failed to go through I called her back she stated that's the Only Fax  Machine in their Office and they have no other Problem with other Faxes coming through for Me Just to Mail the OV Note it has Been placed In Mail. [facility] [address] [place] [address] 10/20/12/KM

## 2012-10-26 ENCOUNTER — Other Ambulatory Visit: Payer: Self-pay | Admitting: *Deleted

## 2012-10-26 MED ORDER — ATORVASTATIN CALCIUM 80 MG PO TABS: 80.0000 mg | ORAL_TABLET | Freq: Every day | ORAL | Status: AC

## 2012-10-26 MED ORDER — METOPROLOL TARTRATE 50 MG PO TABS: 50.0000 mg | ORAL_TABLET | Freq: Two times a day (BID) | ORAL | Status: AC

## 2012-11-20 ENCOUNTER — Emergency Department (INDEPENDENT_AMBULATORY_CARE_PROVIDER_SITE_OTHER)
Admission: EM | Admit: 2012-11-20 | Discharge: 2012-11-20 | Disposition: A | Discharge status: Home / Self Care | Payer: Medicare Other | Source: Home / Self Care | Attending: Emergency Medicine | Admitting: Emergency Medicine

## 2012-11-20 ENCOUNTER — Encounter (HOSPITAL_COMMUNITY): Payer: Self-pay | Admitting: Emergency Medicine

## 2012-11-20 DIAGNOSIS — S43499A Other sprain of unspecified shoulder joint, initial encounter: Secondary | ICD-10-CM

## 2012-11-20 DIAGNOSIS — S46211A Strain of muscle, fascia and tendon of other parts of biceps, right arm, initial encounter: Secondary | ICD-10-CM

## 2012-11-20 DIAGNOSIS — S46819A Strain of other muscles, fascia and tendons at shoulder and upper arm level, unspecified arm, initial encounter: Secondary | ICD-10-CM

## 2012-11-20 DIAGNOSIS — S46011A Strain of muscle(s) and tendon(s) of the rotator cuff of right shoulder, initial encounter: Secondary | ICD-10-CM

## 2012-11-20 MED ORDER — HYDROCODONE-ACETAMINOPHEN 5-325 MG PO TABS
1.0000 | ORAL_TABLET | Freq: Once | ORAL | Status: AC
  Administered 2012-11-20: 1 via ORAL

## 2012-11-20 MED ORDER — OXYCODONE-ACETAMINOPHEN 5-325 MG PO TABS: ORAL_TABLET | ORAL | Status: AC

## 2012-11-20 MED ORDER — HYDROCODONE-ACETAMINOPHEN 5-325 MG PO TABS
ORAL_TABLET | ORAL | Status: AC
  Filled 2012-11-20: qty 1

## 2012-11-20 NOTE — ED Notes (Signed)
Pt c/o pain on right bicep muscle onset 2 weeks Recalls getting her cat out from under her bed and since then she's had the pain Pain radiates upward to right side of neck and down to arm Taking hydrocodone and flexeril that was Rx for other medical problems Denies: f/v/n/d  She is alert and oriented w/no signs of acute distress.

## 2012-11-20 NOTE — ED Provider Notes (Signed)
Chief Complaint:   Chief Complaint  Patient presents with  . Arm Pain    History of Present Illness:   PACHIA STRUM is an 79 year old female who pulled her cat from under a bed a little over 2 weeks ago. She experienced sudden pain in the belly of the right biceps muscle. This radiated up into her right shoulder, right side of the neck, and across to the left shoulder as well. There was no swelling, bruising, or deformity. She states the pain is constant and sometimes rated 10 over 10 in intensity. It's worse with movement of the elbow both flexion and extension and movement of the shoulder. She also has pain with movement of the neck. The arm feels weak but there is no numbness or tingling.  Review of Systems:  Other than noted above, the patient denies any of the following symptoms: Systemic:  No fevers, chills, sweats, or aches.  No fatigue or tiredness. Musculoskeletal:  No joint pain, arthritis, bursitis, swelling, back pain, or neck pain. Neurological:  No muscular weakness, paresthesias, headache, or trouble with speech or coordination.  No dizziness.  PMFSH:  Past medical history, family history, social history, meds, and allergies were reviewed.  She has multiple chronic illnesses including coronary artery disease, diabetes, and hypertension. She is on a long list of medications.  Physical Exam:   Vital signs:  BP 182/71  Pulse 60  Temp(Src) 98.2 F (36.8 C) (Oral)  Resp 16  SpO2 99% Gen:  Alert and oriented times 3.  In no distress. Musculoskeletal: She has diffuse pain to palpation beginning in the belly of the biceps and triceps muscles of the right arm extending to the shoulder, trapezius ridges of the neck, and some in the left shoulder as well. Her elbow has a full range of motion but she complains of pain with both flexion and full extension. Her shoulder also has a full range of motion. She complains of pain with abduction and flexion. Impingement signs are positive. Her  neck has a full range of motion but she has pain with movement in all directions. She also has some pain with movement of her left shoulder as well. Neer test was positive.  Hawkins test was positive.  Empty cans test was positive. Otherwise, all joints had a full a ROM with no swelling, bruising or deformity.  No edema, pulses full. Extremities were warm and pink.  Capillary refill was brisk.  Skin:  Clear, warm and dry.  No rash. Neuro:  Alert and oriented times 3.  Muscle strength was normal.  Sensation was intact to light touch.   Course in Urgent Care Center:   She was placed in a sling. Given Norco 5/325 by mouth for pain.  Assessment:  The primary encounter diagnosis was Biceps strain, right, initial encounter. A diagnosis of Rotator cuff strain, right, initial encounter was also pertinent to this visit.  I think she has strained her biceps muscle and also for rotator cuff. She'll need followup by orthopedist. She was referred to Dr. Shelle Iron for further evaluation.  Plan:   1.  The following meds were prescribed:   Discharge Medication List as of 11/20/2012  3:23 PM    START taking these medications   Details  oxyCODONE-acetaminophen (PERCOCET) 5-325 MG per tablet 1 to 2 tablets every 6 hours as needed for pain., Print       2.  The patient was instructed in symptomatic care, including rest and activity, elevation, application of ice and  compression.  Appropriate handouts were given. 3.  The patient was told to return if becoming worse in any way, if no better in 3 or 4 days, and given some red flag symptoms such as worsening pain or neurological symptoms that would indicate earlier return.   4.  The patient was told to follow up with Dr. Jene Every in one week.    Reuben Likes, MD 11/20/12 2041

## 2012-12-08 ENCOUNTER — Other Ambulatory Visit: Payer: Self-pay | Admitting: *Deleted

## 2012-12-08 MED ORDER — FUROSEMIDE 40 MG PO TABS: 40.0000 mg | ORAL_TABLET | Freq: Every day | ORAL | Status: AC

## 2013-02-13 ENCOUNTER — Other Ambulatory Visit: Payer: Self-pay | Admitting: Cardiology

## 2013-02-20 ENCOUNTER — Other Ambulatory Visit: Payer: Self-pay | Admitting: Cardiology

## 2013-03-07 ENCOUNTER — Other Ambulatory Visit: Payer: Self-pay | Admitting: Cardiology

## 2013-03-10 ENCOUNTER — Telehealth: Payer: Self-pay | Admitting: Cardiology

## 2013-03-10 NOTE — Telephone Encounter (Signed)
Spoke with patient who would like Dr. Patty Sermons to help her get a new walker. Patient states she would like the same type of walker as she has now. Patient states hers is falling apart and is too heavy (she can barely get it into and out of her car).  Patient states she is homebound until she gets a new walker.  Patient has Medicaid and [employer].  I advised patient that I am sending message to Dr. Patty Sermons for advice.  Patient verbalized understanding and will await a return call from [place] regarding whether a written order needs to be picked up from the office or rather we can send something to the insurance company.

## 2013-03-10 NOTE — Telephone Encounter (Signed)
New problem   Pt need to speak to nurse about getting a new walker. Please call pt.

## 2013-03-10 NOTE — Telephone Encounter (Signed)
Order written for lightweight walker and patient notified that Rx is at the front desk and ready to be picked up whenever her family is able.

## 2013-03-10 NOTE — Telephone Encounter (Signed)
Okay to authorize new lightweight walker.

## 2013-04-28 ENCOUNTER — Other Ambulatory Visit: Payer: Self-pay | Admitting: Neurosurgery

## 2013-04-28 DIAGNOSIS — M5010 Cervical disc disorder with radiculopathy, unspecified cervical region: Secondary | ICD-10-CM

## 2013-04-28 DIAGNOSIS — M545 Low back pain: Secondary | ICD-10-CM

## 2013-05-06 ENCOUNTER — Ambulatory Visit
Admission: RE | Admit: 2013-05-06 | Discharge: 2013-05-06 | Disposition: A | Discharge status: Home / Self Care | Payer: Medicare Other | Source: Ambulatory Visit | Attending: Neurosurgery | Admitting: Neurosurgery

## 2013-05-06 DIAGNOSIS — M5010 Cervical disc disorder with radiculopathy, unspecified cervical region: Secondary | ICD-10-CM

## 2013-05-06 DIAGNOSIS — M545 Low back pain: Secondary | ICD-10-CM

## 2013-05-14 ENCOUNTER — Other Ambulatory Visit: Payer: Self-pay | Admitting: Neurosurgery

## 2013-05-14 DIAGNOSIS — M5010 Cervical disc disorder with radiculopathy, unspecified cervical region: Secondary | ICD-10-CM

## 2013-05-19 ENCOUNTER — Ambulatory Visit
Admission: RE | Admit: 2013-05-19 | Discharge: 2013-05-19 | Disposition: A | Discharge status: Home / Self Care | Payer: Medicare Other | Source: Ambulatory Visit | Attending: Neurosurgery | Admitting: Neurosurgery

## 2013-05-19 VITALS — BP 178/85 | HR 61

## 2013-05-19 DIAGNOSIS — M5010 Cervical disc disorder with radiculopathy, unspecified cervical region: Secondary | ICD-10-CM

## 2013-05-19 MED ORDER — IOHEXOL 300 MG/ML  SOLN
1.0000 mL | Freq: Once | INTRAMUSCULAR | Status: AC | PRN
  Administered 2013-05-19: 1 mL via EPIDURAL

## 2013-05-19 MED ORDER — TRIAMCINOLONE ACETONIDE 40 MG/ML IJ SUSP (RADIOLOGY)
40.0000 mg | Freq: Once | INTRAMUSCULAR | Status: AC
  Administered 2013-05-19: 60 mg via EPIDURAL

## 2013-06-17 ENCOUNTER — Other Ambulatory Visit: Payer: Self-pay | Admitting: Cardiology

## 2013-06-18 ENCOUNTER — Other Ambulatory Visit: Payer: Self-pay | Admitting: Cardiology

## 2013-06-23 ENCOUNTER — Other Ambulatory Visit: Payer: Self-pay | Admitting: Cardiology

## 2013-06-24 ENCOUNTER — Other Ambulatory Visit: Payer: Self-pay | Admitting: *Deleted

## 2013-06-24 MED ORDER — ISOSORBIDE MONONITRATE ER 120 MG PO TB24: ORAL_TABLET | ORAL | Status: DC

## 2013-07-27 ENCOUNTER — Encounter (HOSPITAL_COMMUNITY): Payer: Self-pay | Admitting: Emergency Medicine

## 2013-07-27 ENCOUNTER — Emergency Department (HOSPITAL_COMMUNITY): Payer: Medicare Other

## 2013-07-27 ENCOUNTER — Emergency Department (HOSPITAL_COMMUNITY)
Admission: EM | Admit: 2013-07-27 | Discharge: 2013-07-27 | Disposition: A | Discharge status: Home / Self Care | Payer: Medicare Other | Attending: Emergency Medicine | Admitting: Emergency Medicine

## 2013-07-27 DIAGNOSIS — Y939 Activity, unspecified: Secondary | ICD-10-CM | POA: Insufficient documentation

## 2013-07-27 DIAGNOSIS — Y929 Unspecified place or not applicable: Secondary | ICD-10-CM | POA: Insufficient documentation

## 2013-07-27 DIAGNOSIS — R55 Syncope and collapse: Secondary | ICD-10-CM | POA: Insufficient documentation

## 2013-07-27 DIAGNOSIS — Z9861 Coronary angioplasty status: Secondary | ICD-10-CM | POA: Insufficient documentation

## 2013-07-27 DIAGNOSIS — R404 Transient alteration of awareness: Secondary | ICD-10-CM | POA: Insufficient documentation

## 2013-07-27 DIAGNOSIS — R011 Cardiac murmur, unspecified: Secondary | ICD-10-CM | POA: Insufficient documentation

## 2013-07-27 DIAGNOSIS — Z79899 Other long term (current) drug therapy: Secondary | ICD-10-CM | POA: Insufficient documentation

## 2013-07-27 DIAGNOSIS — E1169 Type 2 diabetes mellitus with other specified complication: Secondary | ICD-10-CM | POA: Insufficient documentation

## 2013-07-27 DIAGNOSIS — IMO0002 Reserved for concepts with insufficient information to code with codable children: Secondary | ICD-10-CM | POA: Insufficient documentation

## 2013-07-27 DIAGNOSIS — R296 Repeated falls: Secondary | ICD-10-CM | POA: Insufficient documentation

## 2013-07-27 DIAGNOSIS — I10 Essential (primary) hypertension: Secondary | ICD-10-CM | POA: Insufficient documentation

## 2013-07-27 DIAGNOSIS — E039 Hypothyroidism, unspecified: Secondary | ICD-10-CM | POA: Insufficient documentation

## 2013-07-27 DIAGNOSIS — E785 Hyperlipidemia, unspecified: Secondary | ICD-10-CM | POA: Insufficient documentation

## 2013-07-27 DIAGNOSIS — E162 Hypoglycemia, unspecified: Secondary | ICD-10-CM

## 2013-07-27 DIAGNOSIS — Z87891 Personal history of nicotine dependence: Secondary | ICD-10-CM | POA: Insufficient documentation

## 2013-07-27 DIAGNOSIS — G8929 Other chronic pain: Secondary | ICD-10-CM | POA: Insufficient documentation

## 2013-07-27 DIAGNOSIS — Z7982 Long term (current) use of aspirin: Secondary | ICD-10-CM | POA: Insufficient documentation

## 2013-07-27 DIAGNOSIS — D509 Iron deficiency anemia, unspecified: Secondary | ICD-10-CM | POA: Insufficient documentation

## 2013-07-27 LAB — CBC WITH DIFFERENTIAL/PLATELET
Band Neutrophils: 0 % (ref 0–10)
Basophils Absolute: 0 10*3/uL (ref 0.0–0.1)
Basophils Relative: 0 % (ref 0–1)
Blasts: 0 %
EOS ABS: 0 10*3/uL (ref 0.0–0.7)
EOS PCT: 0 % (ref 0–5)
HEMATOCRIT: 36.8 % (ref 36.0–46.0)
HEMOGLOBIN: 12.3 g/dL (ref 12.0–15.0)
Lymphocytes Relative: 7 % — ABNORMAL LOW (ref 12–46)
Lymphs Abs: 0.8 10*3/uL (ref 0.7–4.0)
MCH: 31.9 pg (ref 26.0–34.0)
MCHC: 33.4 g/dL (ref 30.0–36.0)
MCV: 95.6 fL (ref 78.0–100.0)
METAMYELOCYTES PCT: 0 %
MONO ABS: 0.4 10*3/uL (ref 0.1–1.0)
MONOS PCT: 4 % (ref 3–12)
Myelocytes: 0 %
NRBC: 0 /100{WBCs}
Neutro Abs: 9.9 10*3/uL — ABNORMAL HIGH (ref 1.7–7.7)
Neutrophils Relative %: 89 % — ABNORMAL HIGH (ref 43–77)
PLATELETS: 107 10*3/uL — AB (ref 150–400)
Promyelocytes Absolute: 0 %
RBC: 3.85 MIL/uL — AB (ref 3.87–5.11)
RDW: 12.9 % (ref 11.5–15.5)
WBC: 11.1 10*3/uL — AB (ref 4.0–10.5)

## 2013-07-27 LAB — BASIC METABOLIC PANEL
BUN: 30 mg/dL — AB (ref 6–23)
CALCIUM: 9.2 mg/dL (ref 8.4–10.5)
CO2: 19 mEq/L (ref 19–32)
Chloride: 108 mEq/L (ref 96–112)
Creatinine, Ser: 1.11 mg/dL — ABNORMAL HIGH (ref 0.50–1.10)
GFR calc Af Amer: 52 mL/min — ABNORMAL LOW (ref >90)
GFR, EST NON AFRICAN AMERICAN: 45 mL/min — AB (ref >90)
GLUCOSE: 152 mg/dL — AB (ref 70–99)
Potassium: 4.1 mEq/L (ref 3.7–5.3)
SODIUM: 143 meq/L (ref 137–147)

## 2013-07-27 LAB — TROPONIN I

## 2013-07-27 LAB — GLUCOSE, CAPILLARY
GLUCOSE-CAPILLARY: 121 mg/dL — AB (ref 70–99)
Glucose-Capillary: 60 mg/dL — ABNORMAL LOW (ref 70–99)

## 2013-07-27 MED ORDER — DEXTROSE 50 % IV SOLN
1.0000 | Freq: Once | INTRAVENOUS | Status: AC
  Administered 2013-07-27: 50 mL via INTRAVENOUS
  Filled 2013-07-27: qty 50

## 2013-07-27 NOTE — ED Provider Notes (Signed)
CSN: [id number]     Arrival date & time 07/27/13  1218 History   First MD Initiated Contact with Patient 07/27/13 1329     Chief Complaint  Patient presents with  . Fall  . Hypoglycemia   (Consider location/radiation/quality/duration/timing/severity/associated sxs/prior Treatment) Patient is a 80 y.o. female presenting with syncope.  Loss of Consciousness Episode history:  Single Most recent episode:  Today Duration: unknown. Timing:  Constant Progression:  Resolved Chronicity:  New Context comment:  Pt woke up, blood glucose was in 70's, she took her insulin, then tried to fix some coffee, she doesn't remember what happened next.   Witnessed: no   Relieved by: dextrose. Worsened by:  Nothing tried Associated symptoms: no chest pain, no difficulty breathing, no fever, no nausea, no shortness of breath and no vomiting     Past Medical History  Diagnosis Date  . Hyperlipidemia   . Diabetes mellitus   . Hypertension   . IHD (ischemic heart disease)   . Anginal pain   . Hypothyroidism   . Chronic back pain   . Iron deficiency anemia   . Heart murmur    Past Surgical History  Procedure Laterality Date  . Coronary angioplasty  12/23/96    CARDIAC SIZE AND SILHOUETTE WERE NORMAL. THERE WAS INFERIOR HYPOKINESIS EF 45%  . Transthoracic echocardiogram  08/2006    SHOWED MITRAL REGURGITATION  . Appendectomy     Family History  Problem Relation Age of Onset  . Heart disease Father   . Heart attack Father   . Stroke Brother   . Cancer Brother    History  Substance Use Topics  . Smoking status: Former Smoker    Quit date: 12/17/1980  . Smokeless tobacco: Not on file  . Alcohol Use: No   OB History   Grav Para Term Preterm Abortions TAB SAB Ect Mult Living                 Review of Systems  Constitutional: Negative for fever.  HENT: Negative for congestion.   Respiratory: Negative for cough and shortness of breath.   Cardiovascular: Positive for syncope. Negative for  chest pain.  Gastrointestinal: Negative for nausea, vomiting, abdominal pain and diarrhea.  All other systems reviewed and are negative.    Allergies  Gabapentin and Zestril  Home Medications   Current Outpatient Rx  Name  Route  Sig  Dispense  Refill  . ALPRAZolam (XANAX) 1 MG tablet   Oral   Take 1 tablet (1 mg total) by mouth at bedtime.   30 tablet   3   . aspirin 325 MG tablet   Oral   Take 325 mg by mouth daily.           Marland Kitchen. atorvastatin (LIPITOR) 80 MG tablet   Oral   Take 1 tablet (80 mg total) by mouth daily.   90 tablet   3   . cyclobenzaprine (FLEXERIL) 5 MG tablet   Oral   Take 5 mg by mouth 3 (three) times daily as needed for muscle spasms.         . Ferrous Sulfate (IRON) 325 (65 FE) MG TABS   Oral   Take 1 tablet by mouth daily.          . folic acid (FOLVITE) 1 MG tablet   Oral   Take 1 mg by mouth daily.         . furosemide (LASIX) 40 MG tablet   Oral  Take 1 tablet (40 mg total) by mouth daily.   90 tablet   3   . glimepiride (AMARYL) 4 MG tablet   Oral   Take 4 mg by mouth daily with breakfast.         . HYDROcodone-acetaminophen (NORCO/VICODIN) 5-325 MG per tablet   Oral   Take 1 tablet by mouth every 6 (six) hours as needed for moderate pain.          . isosorbide mononitrate (IMDUR) 120 MG 24 hr tablet   Oral   Take 120 mg by mouth daily.         Marland Kitchen LANTUS SOLOSTAR 100 UNIT/ML injection   Subcutaneous   Inject 25 Units into the skin daily.          Marland Kitchen levothyroxine (SYNTHROID, LEVOTHROID) 150 MCG tablet   Oral   Take 150 mcg by mouth daily before breakfast.         . losartan (COZAAR) 100 MG tablet   Oral   Take 1 tablet (100 mg total) by mouth daily.   90 tablet   3   . meclizine (ANTIVERT) 25 MG tablet   Oral   Take 25 mg by mouth 3 (three) times daily as needed for dizziness.         . metoprolol (LOPRESSOR) 50 MG tablet   Oral   Take 1 tablet (50 mg total) by mouth 2 (two) times daily.   180  tablet   3   . Multiple Vitamin (MULTIVITAMIN) tablet   Oral   Take 1 tablet by mouth daily.           . Multiple Vitamins-Minerals ([employer] SPECTRAVITE ADVANCED PO)   Oral   Take 1 tablet by mouth daily.          . nitroGLYCERIN (NITROSTAT) 0.4 MG SL tablet   Sublingual   Place 0.4 mg under the tongue every 5 (five) minutes as needed. For chest pain         . nystatin-triamcinolone (MYCOLOG II) cream   Topical   Apply topically 2 (two) times daily as needed.   60 g   0   . oxyCODONE-acetaminophen (PERCOCET) 5-325 MG per tablet      1 to 2 tablets every 6 hours as needed for pain.   20 tablet   0    BP 177/70  Pulse 63  Temp(Src) 97.9 F (36.6 C) (Oral)  Resp 18  SpO2 99% Physical Exam  Nursing note and vitals reviewed. Constitutional: She is oriented to person, place, and time. She appears well-developed and well-nourished. No distress.  HENT:  Head: Normocephalic and atraumatic. Head is without raccoon's eyes and without Battle's sign.  Nose: Nose normal.  Mouth/Throat: Oropharynx is clear and moist.  Eyes: Conjunctivae and EOM are normal. Pupils are equal, round, and reactive to light. No scleral icterus.  Neck: Normal range of motion. Neck supple. No spinous process tenderness and no muscular tenderness present.  Cardiovascular: Normal rate, regular rhythm, normal heart sounds and intact distal pulses.   No murmur heard. Pulmonary/Chest: Effort normal and breath sounds normal. No stridor. No respiratory distress. She has no rales. She exhibits no tenderness.  Abdominal: Soft. Bowel sounds are normal. She exhibits no distension. There is no tenderness. There is no rebound and no guarding.  Musculoskeletal: Normal range of motion. She exhibits no edema.       Right knee: She exhibits ecchymosis (small abrasion.). She exhibits normal range of motion, no swelling,  no effusion and no deformity. Tenderness found.       Thoracic back: She exhibits no tenderness and no  bony tenderness.       Lumbar back: She exhibits no tenderness and no bony tenderness.  No evidence of trauma to extremities, except as noted.  2+ distal pulses.    Neurological: She is alert and oriented to person, place, and time.  Skin: Skin is warm and dry. No rash noted.  Psychiatric: She has a normal mood and affect. Her behavior is normal.    ED Course  Procedures (including critical care time) Labs Review Labs Reviewed  GLUCOSE, CAPILLARY - Abnormal; Notable for the following:    Glucose-Capillary 60 (*)    All other components within normal limits  CBC WITH DIFFERENTIAL - Abnormal; Notable for the following:    WBC 11.1 (*)    RBC 3.85 (*)    Platelets 107 (*)    Neutrophils Relative % 89 (*)    Lymphocytes Relative 7 (*)    Neutro Abs 9.9 (*)    All other components within normal limits  BASIC METABOLIC PANEL - Abnormal; Notable for the following:    Glucose, Bld 152 (*)    BUN 30 (*)    Creatinine, Ser 1.11 (*)    GFR calc non Af Amer 45 (*)    GFR calc Af Amer 52 (*)    All other components within normal limits  GLUCOSE, CAPILLARY - Abnormal; Notable for the following:    Glucose-Capillary 121 (*)    All other components within normal limits  TROPONIN I   Imaging Review Dg Ankle Complete Left  07/27/2013   CLINICAL DATA:  Status post fall.  Left ankle pain.  EXAM: LEFT ANKLE COMPLETE - 3+ VIEW  COMPARISON:  None.  FINDINGS: No acute bony or joint abnormality is identified. Small calcaneal spur is noted. Soft tissue structures are unremarkable.  IMPRESSION: Negative exam.   Electronically Signed   By: Drusilla Kanner M.D.   On: 07/27/2013 15:27   Dg Ankle Complete Right  07/27/2013   CLINICAL DATA:  Status post fall.  Right ankle pain.  EXAM: RIGHT ANKLE - COMPLETE 3+ VIEW  COMPARISON:  None.  FINDINGS: No acute bony or joint abnormality is identified. Soft tissue structures are unremarkable. Small calcaneal spur is noted.  IMPRESSION: Negative exam.   Electronically  Signed   By: Drusilla Kanner M.D.   On: 07/27/2013 15:29   Ct Head Wo Contrast  07/27/2013   CLINICAL DATA:  Patient found down.  EXAM: CT HEAD WITHOUT CONTRAST  CT CERVICAL SPINE WITHOUT CONTRAST  TECHNIQUE: Multidetector CT imaging of the head and cervical spine was performed following the standard protocol without intravenous contrast. Multiplanar CT image reconstructions of the cervical spine were also generated.  COMPARISON:  MRI cervical spine 05/06/2013.  FINDINGS: CT HEAD FINDINGS  Chronic microvascular ischemic change is identified. There is no evidence of acute abnormality including infarction, hemorrhage, mass lesion, mass effect, midline shift or abnormal extra-axial fluid collection. There is no hydrocephalus or pneumocephalus. Mucosal thickening right sphenoid sinus is noted. The calvarium is intact.  CT CERVICAL SPINE FINDINGS  No cervical spine fracture or malalignment is identified. There is reversal of the normal cervical lordosis and marked multilevel spondylosis. Loss of disc space height appears worst at C5-6. Advanced multilevel facet arthropathy is noted. Lung apices are clear. Cystic lesion left lobe of thyroid is identified and seen on the comparison MRI.  IMPRESSION: No acute  finding head or cervical spine.  Chronic microvascular ischemic change.  Severe multilevel spondylosis.   Electronically Signed   By: Drusilla Kanner M.D.   On: 07/27/2013 15:16   Ct Cervical Spine Wo Contrast  07/27/2013   CLINICAL DATA:  Patient found down.  EXAM: CT HEAD WITHOUT CONTRAST  CT CERVICAL SPINE WITHOUT CONTRAST  TECHNIQUE: Multidetector CT imaging of the head and cervical spine was performed following the standard protocol without intravenous contrast. Multiplanar CT image reconstructions of the cervical spine were also generated.  COMPARISON:  MRI cervical spine 05/06/2013.  FINDINGS: CT HEAD FINDINGS  Chronic microvascular ischemic change is identified. There is no evidence of acute abnormality  including infarction, hemorrhage, mass lesion, mass effect, midline shift or abnormal extra-axial fluid collection. There is no hydrocephalus or pneumocephalus. Mucosal thickening right sphenoid sinus is noted. The calvarium is intact.  CT CERVICAL SPINE FINDINGS  No cervical spine fracture or malalignment is identified. There is reversal of the normal cervical lordosis and marked multilevel spondylosis. Loss of disc space height appears worst at C5-6. Advanced multilevel facet arthropathy is noted. Lung apices are clear. Cystic lesion left lobe of thyroid is identified and seen on the comparison MRI.  IMPRESSION: No acute finding head or cervical spine.  Chronic microvascular ischemic change.  Severe multilevel spondylosis.   Electronically Signed   By: Drusilla Kanner M.D.   On: 07/27/2013 15:16   Dg Knee Complete 4 Views Right  07/27/2013   CLINICAL DATA:  Status post fall.  Right knee pain.  EXAM: RIGHT KNEE - COMPLETE 4+ VIEW  COMPARISON:  None.  FINDINGS: There is no acute bony or joint abnormality. No joint effusion is seen. Chondrocalcinosis is noted. Atherosclerotic vascular disease is identified.  IMPRESSION: No acute finding.   Electronically Signed   By: Drusilla Kanner M.D.   On: 07/27/2013 15:30  All radiology studies independently viewed by me.     EKG Interpretation    Date/Time:  Wednesday July 27 2013 14:35:11 EST Ventricular Rate:  61 PR Interval:    QRS Duration: 144 QT Interval:  468 QTC Calculation: 471 R Axis:   38 Text Interpretation:  Sinus rhythm IVCD, consider atypical LBBB No significant change was found Confirmed by [facility]  MD, TREY ([id number]) on 07/27/2013 3:38:08 PM            MDM   1. Syncope   2. Hypoglycemia    80 yo female with syncope in the setting of hypoglycemia with clear cause (blood sugar low, took insulin, did not eat.) She fell and had some minor bruises, but no evidence of major injury.  Her bloodwork, EKG, and imaging were reassuring.  Her  blood sugars remained stable and she was able to eat a meal.  Her syncope was not felt to be secondary to cardiogenic syncope given clear alternative source.  Felt to be safe for discharge and will followup with PCP.    Candyce Churn, MD 07/27/13 [phone]

## 2013-07-27 NOTE — ED Notes (Signed)
Per EMS- pt was found on floor. Pt lives with grandson. Pt does not remember falling. States that she does remember taking her insulin this morning after checking CBG and it was 79. Pt was found to have CBG of 46.pt was gin=ven 12.5 of D50. Pt cbg increased to 183. Pt has approx 2cm  laceration and deformity to rt knee. Pt has limited movement in rt leg as well. Pain to bilateral knees but rt is worse.

## 2013-07-27 NOTE — Discharge Instructions (Signed)
Hypoglycemia (Low Blood Sugar) Hypoglycemia is when the glucose (sugar) in your blood is too low. Hypoglycemia can happen for many reasons. It can happen to people with or without diabetes. Hypoglycemia can develop quickly and can be a medical emergency.  CAUSES  Having hypoglycemia does not mean that you will develop diabetes. Different causes include:  Missed or delayed meals or not enough carbohydrates eaten.  Medication overdose. This could be by accident or deliberate. If by accident, your medication may need to be adjusted or changed.  Exercise or increased activity without adjustments in carbohydrates or medications.  A nerve disorder that affects body functions like your heart rate, blood pressure and digestion (autonomic neuropathy).  A condition where the stomach muscles do not function properly (gastroparesis). Therefore, medications may not absorb properly.  The inability to recognize the signs of hypoglycemia (hypoglycemic unawareness).  Absorption of insulin  may be altered.  Alcohol consumption.  Pregnancy/menstrual cycles/postpartum. This may be due to hormones.  Certain kinds of tumors. This is very rare. SYMPTOMS   Sweating.  Hunger.  Dizziness.  Blurred vision.  Drowsiness.  Weakness.  Headache.  Rapid heart beat.  Shakiness.  Nervousness. DIAGNOSIS  Diagnosis is made by monitoring blood glucose in one or all of the following ways:  Fingerstick blood glucose monitoring.  Laboratory results. TREATMENT  If you think your blood glucose is low:  Check your blood glucose, if possible. If it is less than 70 mg/dl, take one of the following:  3-4 glucose tablets.   cup juice (prefer clear like apple).   cup "regular" soda pop.  1 cup milk.  -1 tube of glucose gel.  5-6 hard candies.  Do not over treat because your blood glucose (sugar) will only go too high.  Wait 15 minutes and recheck your blood glucose. If it is still less than  70 mg/dl (or below your target range), repeat treatment.  Eat a snack if it is more than one hour until your next meal. Sometimes, your blood glucose may go so low that you are unable to treat yourself. You may need someone to help you. You may even pass out or be unable to swallow. This may require you to get an injection of glucagon, which raises the blood glucose. HOME CARE INSTRUCTIONS  Check blood glucose as recommended by your caregiver.  Take medication as prescribed by your caregiver.  Follow your meal plan. Do not skip meals. Eat on time.  If you are going to drink alcohol, drink it only with meals.  Check your blood glucose before driving.  Check your blood glucose before and after exercise. If you exercise longer or different than usual, be sure to check blood glucose more frequently.  Always carry treatment with you. Glucose tablets are the easiest to carry.  Always wear medical alert jewelry or carry some form of identification that states that you have diabetes. This will alert people that you have diabetes. If you have hypoglycemia, they will have a better idea on what to do. SEEK MEDICAL CARE IF:   You are having problems keeping your blood sugar at target range.  You are having frequent episodes of hypoglycemia.  You feel you might be having side effects from your medicines.  You have symptoms of an illness that is not improving after 3-4 days.  You notice a change in vision or a new problem with your vision. SEEK IMMEDIATE MEDICAL CARE IF:   You are a family member or friend of a  person whose blood glucose goes below 70 mg/dl and is accompanied by:  Confusion.  A change in mental status.  The inability to swallow.  Passing out. Document Released: 07/07/2005 Document Revised: 09/29/2011 Document Reviewed: 11/03/2011 [facility]Exit[facility] Patient Information 2014 [place], [place].  Syncope Syncope is a fainting spell. This means the person loses consciousness and  drops to the ground. The person is generally unconscious for less than 5 minutes. The person may have some muscle twitches for up to 15 seconds before waking up and returning to normal. Syncope occurs more often in elderly people, but it can happen to anyone. While most causes of syncope are not dangerous, syncope can be a sign of a serious medical problem. It is important to seek medical care.  CAUSES  Syncope is caused by a sudden decrease in blood flow to the brain. The specific cause is often not determined. Factors that can trigger syncope include:  Taking medicines that lower blood pressure.  Sudden changes in posture, such as standing up suddenly.  Taking more medicine than prescribed.  Standing in one place for too long.  Seizure disorders.  Dehydration and excessive exposure to heat.  Low blood sugar (hypoglycemia).  Straining to have a bowel movement.  Heart disease, irregular heartbeat, or other circulatory problems.  Fear, emotional distress, seeing blood, or severe pain. SYMPTOMS  Right before fainting, you may:  Feel dizzy or lightheaded.  Feel nauseous.  See all white or all black in your field of vision.  Have cold, clammy skin. DIAGNOSIS  Your caregiver will ask about your symptoms, perform a physical exam, and perform electrocardiography (ECG) to record the electrical activity of your heart. Your caregiver may also perform other heart or blood tests to determine the cause of your syncope. TREATMENT  In most cases, no treatment is needed. Depending on the cause of your syncope, your caregiver may recommend changing or stopping some of your medicines. HOME CARE INSTRUCTIONS  Have someone stay with you until you feel stable.  Do not drive, operate machinery, or play sports until your caregiver says it is okay.  Keep all follow-up appointments as directed by your caregiver.  Lie down right away if you start feeling like you might faint. Breathe deeply and  steadily. Wait until all the symptoms have passed.  Drink enough fluids to keep your urine clear or pale yellow.  If you are taking blood pressure or heart medicine, get up slowly, taking several minutes to sit and then stand. This can reduce dizziness. SEEK IMMEDIATE MEDICAL CARE IF:   You have a severe headache.  You have unusual pain in the chest, abdomen, or back.  You are bleeding from the mouth or rectum, or you have black or tarry stool.  You have an irregular or very fast heartbeat.  You have pain with breathing.  You have repeated fainting or seizure-like jerking during an episode.  You faint when sitting or lying down.  You have confusion.  You have difficulty walking.  You have severe weakness.  You have vision problems. If you fainted, call your local emergency services (911 in [place] Do not drive yourself to the hospital.  MAKE SURE YOU:  Understand these instructions.  Will watch your condition.  Will get help right away if you are not doing well or get worse. Document Released: 07/07/2005 Document Revised: 01/06/2012 Document Reviewed: 09/05/2011 [facility]ExitCare Patient Information 2014 [place]ExitCare, [place]LL[place].

## 2013-08-04 ENCOUNTER — Other Ambulatory Visit: Payer: Self-pay | Admitting: Cardiology

## 2013-09-13 ENCOUNTER — Other Ambulatory Visit: Payer: Self-pay | Admitting: Cardiology

## 2013-09-13 DIAGNOSIS — E119 Type 2 diabetes mellitus without complications: Secondary | ICD-10-CM

## 2013-09-17 ENCOUNTER — Emergency Department (HOSPITAL_COMMUNITY)
Admission: EM | Admit: 2013-09-17 | Discharge: 2013-09-18 | Disposition: E | Payer: Medicare Other | Attending: Emergency Medicine | Admitting: Emergency Medicine

## 2013-09-17 ENCOUNTER — Encounter (HOSPITAL_COMMUNITY): Payer: Self-pay | Admitting: Emergency Medicine

## 2013-09-17 DIAGNOSIS — Z7982 Long term (current) use of aspirin: Secondary | ICD-10-CM | POA: Insufficient documentation

## 2013-09-17 DIAGNOSIS — I10 Essential (primary) hypertension: Secondary | ICD-10-CM | POA: Insufficient documentation

## 2013-09-17 DIAGNOSIS — I469 Cardiac arrest, cause unspecified: Secondary | ICD-10-CM | POA: Insufficient documentation

## 2013-09-17 DIAGNOSIS — Z79899 Other long term (current) drug therapy: Secondary | ICD-10-CM | POA: Insufficient documentation

## 2013-09-17 DIAGNOSIS — Z87891 Personal history of nicotine dependence: Secondary | ICD-10-CM | POA: Insufficient documentation

## 2013-09-17 DIAGNOSIS — Z794 Long term (current) use of insulin: Secondary | ICD-10-CM | POA: Insufficient documentation

## 2013-09-17 DIAGNOSIS — D509 Iron deficiency anemia, unspecified: Secondary | ICD-10-CM | POA: Insufficient documentation

## 2013-09-17 DIAGNOSIS — I209 Angina pectoris, unspecified: Secondary | ICD-10-CM | POA: Insufficient documentation

## 2013-09-17 DIAGNOSIS — E785 Hyperlipidemia, unspecified: Secondary | ICD-10-CM | POA: Insufficient documentation

## 2013-09-17 DIAGNOSIS — E119 Type 2 diabetes mellitus without complications: Secondary | ICD-10-CM | POA: Insufficient documentation

## 2013-09-17 DIAGNOSIS — Z9861 Coronary angioplasty status: Secondary | ICD-10-CM | POA: Insufficient documentation

## 2013-09-17 DIAGNOSIS — E039 Hypothyroidism, unspecified: Secondary | ICD-10-CM | POA: Insufficient documentation

## 2013-09-17 DIAGNOSIS — R011 Cardiac murmur, unspecified: Secondary | ICD-10-CM | POA: Insufficient documentation

## 2013-09-17 DIAGNOSIS — G8929 Other chronic pain: Secondary | ICD-10-CM | POA: Insufficient documentation

## 2013-09-18 DIAGNOSIS — 419620001 Death: Secondary | SNOMED CT

## 2013-09-18 NOTE — ED Notes (Signed)
Received pt via EMS with c/o found by family at home. Unknown down time. Pt given total of 5 doses of epi, 1 amp of D50, and 1 dose of Narcan by EMS. Pt remained in [facility] for EMS

## 2013-09-18 NOTE — ED Provider Notes (Signed)
CSN: [id number]     Arrival date & time 09/27/2013  5366 History   First MD Initiated Contact with Patient September 27, 2013 [phone]     Chief Complaint  Patient presents with  . Cardiac Arrest      HPI Patient brought in by EMS was found unresponsive and not breathing at home.  Unknown down time.  Ambulance was contacted and ACLS protocol was begun.  Patient was given 5 rounds of epinephrine associated with one amp of D50 and 1 amp of Narcan by EMS prior to arrival.  Patient was in asystole for most of the time with small episode of PEA.  The patient arrived in emergency department she was intubated, unresponsive with fixed and dilated pupils.  Breath sounds were equal.  Breathing was assisted with bag-valve-mask.  Patient cardiac XT was evaluated with bedside ultrasound and she was found to have no cardiac activity on bedside ultrasound.  Patient was pronounced dead at approximately 9 AM. Past Medical History  Diagnosis Date  . Hyperlipidemia   . Diabetes mellitus   . Hypertension   . IHD (ischemic heart disease)   . Anginal pain   . Hypothyroidism   . Chronic back pain   . Iron deficiency anemia   . Heart murmur    Past Surgical History  Procedure Laterality Date  . Coronary angioplasty  12/23/96    CARDIAC SIZE AND SILHOUETTE WERE NORMAL. THERE WAS INFERIOR HYPOKINESIS EF 45%  . Transthoracic echocardiogram  08/2006    SHOWED MITRAL REGURGITATION  . Appendectomy     Family History  Problem Relation Age of Onset  . Heart disease Father   . Heart attack Father   . Stroke Brother   . Cancer Brother    History  Substance Use Topics  . Smoking status: Former Smoker    Quit date: 12/17/1980  . Smokeless tobacco: Not on file  . Alcohol Use: No   OB History   Grav Para Term Preterm Abortions TAB SAB Ect Mult Living                 Review of Systems Level V caveat   Allergies  Gabapentin and Zestril  Home Medications   Current Outpatient Rx  Name  Route  Sig  Dispense  Refill   . ALPRAZolam (XANAX) 1 MG tablet   Oral   Take 1 tablet (1 mg total) by mouth at bedtime.   30 tablet   3   . aspirin 325 MG tablet   Oral   Take 325 mg by mouth daily.           Marland Kitchen atorvastatin (LIPITOR) 80 MG tablet   Oral   Take 1 tablet (80 mg total) by mouth daily.   90 tablet   3   . cyclobenzaprine (FLEXERIL) 5 MG tablet   Oral   Take 5 mg by mouth 3 (three) times daily as needed for muscle spasms.         . Ferrous Sulfate (IRON) 325 (65 FE) MG TABS   Oral   Take 1 tablet by mouth daily.          . folic acid (FOLVITE) 1 MG tablet   Oral   Take 1 mg by mouth daily.         . furosemide (LASIX) 40 MG tablet   Oral   Take 1 tablet (40 mg total) by mouth daily.   90 tablet   3   . glimepiride (AMARYL) 4 MG  tablet      TAKE 1 TABLET BY MOUTH EVERY DAY   90 tablet   1   . HYDROcodone-acetaminophen (NORCO/VICODIN) 5-325 MG per tablet   Oral   Take 1 tablet by mouth every 6 (six) hours as needed for moderate pain.          . isosorbide mononitrate (IMDUR) 120 MG 24 hr tablet   Oral   Take 120 mg by mouth daily.         Marland Kitchen. LANTUS SOLOSTAR 100 UNIT/ML injection   Subcutaneous   Inject 25 Units into the skin daily.          Marland Kitchen. levothyroxine (SYNTHROID, LEVOTHROID) 150 MCG tablet   Oral   Take 150 mcg by mouth daily before breakfast.         . losartan (COZAAR) 100 MG tablet   Oral   Take 1 tablet (100 mg total) by mouth daily.   90 tablet   3   . losartan (COZAAR) 100 MG tablet      TAKE 1 TABLET (100 MG TOTAL) BY MOUTH DAILY.   90 tablet   0     PATIENT NEEDS APPOINTMENT   . meclizine (ANTIVERT) 25 MG tablet   Oral   Take 25 mg by mouth 3 (three) times daily as needed for dizziness.         . metoprolol (LOPRESSOR) 50 MG tablet   Oral   Take 1 tablet (50 mg total) by mouth 2 (two) times daily.   180 tablet   3   . Multiple Vitamin (MULTIVITAMIN) tablet   Oral   Take 1 tablet by mouth daily.           . Multiple  Vitamins-Minerals ([employer] SPECTRAVITE ADVANCED PO)   Oral   Take 1 tablet by mouth daily.          . nitroGLYCERIN (NITROSTAT) 0.4 MG SL tablet   Sublingual   Place 0.4 mg under the tongue every 5 (five) minutes as needed. For chest pain         . nystatin-triamcinolone (MYCOLOG II) cream   Topical   Apply topically 2 (two) times daily as needed.   60 g   0   . oxyCODONE-acetaminophen (PERCOCET) 5-325 MG per tablet      1 to 2 tablets every 6 hours as needed for pain.   20 tablet   0    Wt 153 lb (69.4 kg) Physical Exam  Nursing note and vitals reviewed. Constitutional: She appears well-developed. She appears distressed.  HENT:  Head: Normocephalic and atraumatic.  Pulmonary/Chest: She is in respiratory distress.  Abdominal: Normal appearance. She exhibits no distension.  Neurological: She is unresponsive. GCS eye subscore is 1. GCS verbal subscore is 1. GCS motor subscore is 1.    ED Course  Procedures (including critical care time) Labs Review Labs Reviewed - No data to display Imaging Review No results found.    MDM   Final diagnoses:  D.O.A. (dead on arrival)        Nelia Shiobert L Xaviera Flaten, MD 09/18/13 1536

## 2013-09-18 DEATH — deceased

## 2014-07-14 IMAGING — CR DG ANKLE COMPLETE 3+V*L*
3 series · 3 of 3 positions shown · non-contrast
Comparison: None.

CLINICAL DATA: Status post fall.  Left ankle pain.

EXAM:
LEFT ANKLE COMPLETE - 3+ VIEW

[x ankle ap left]
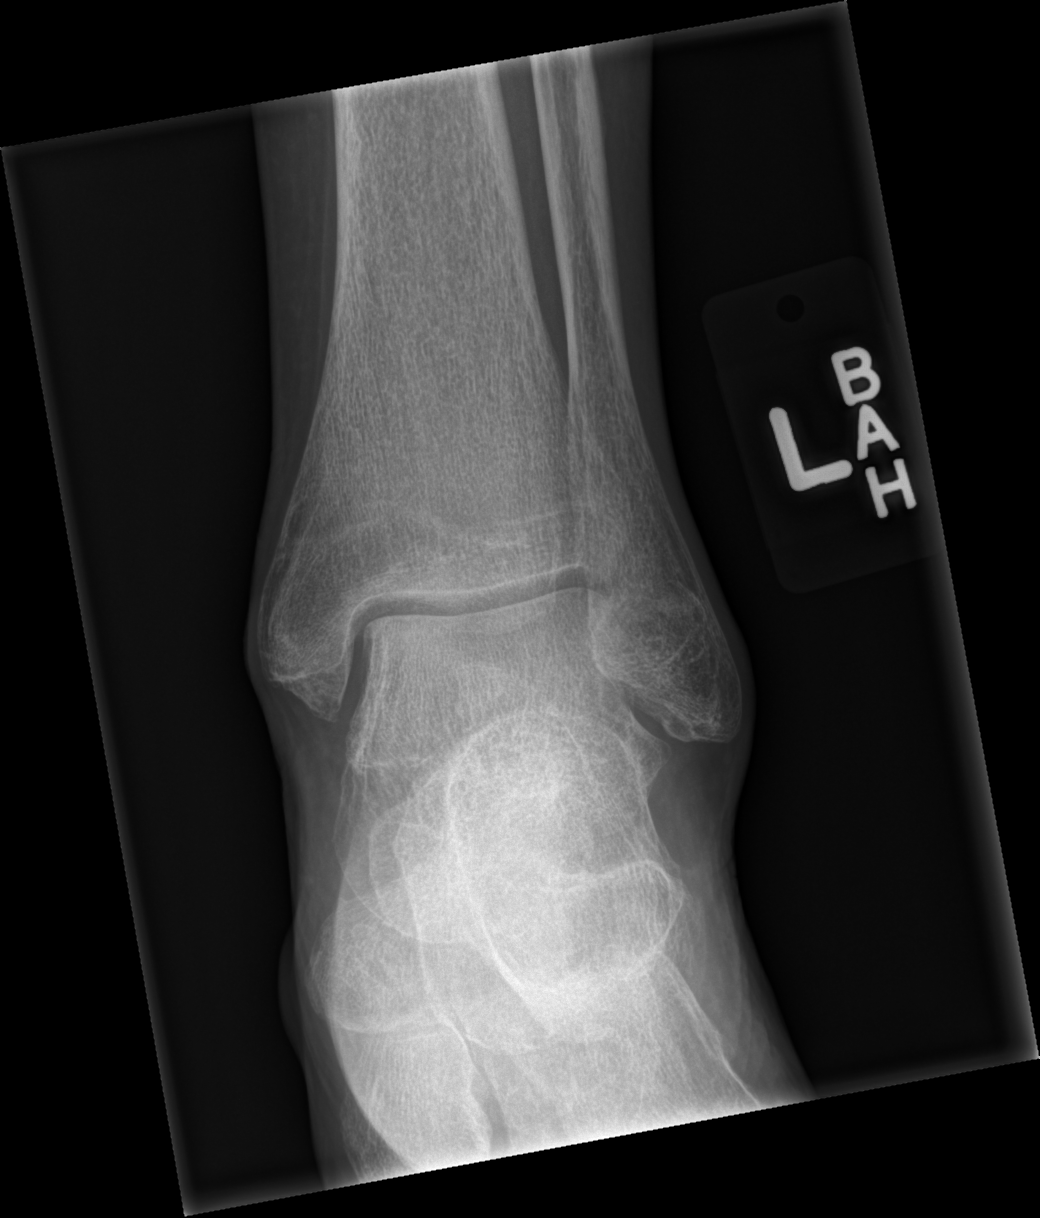

[x ankle obl left]
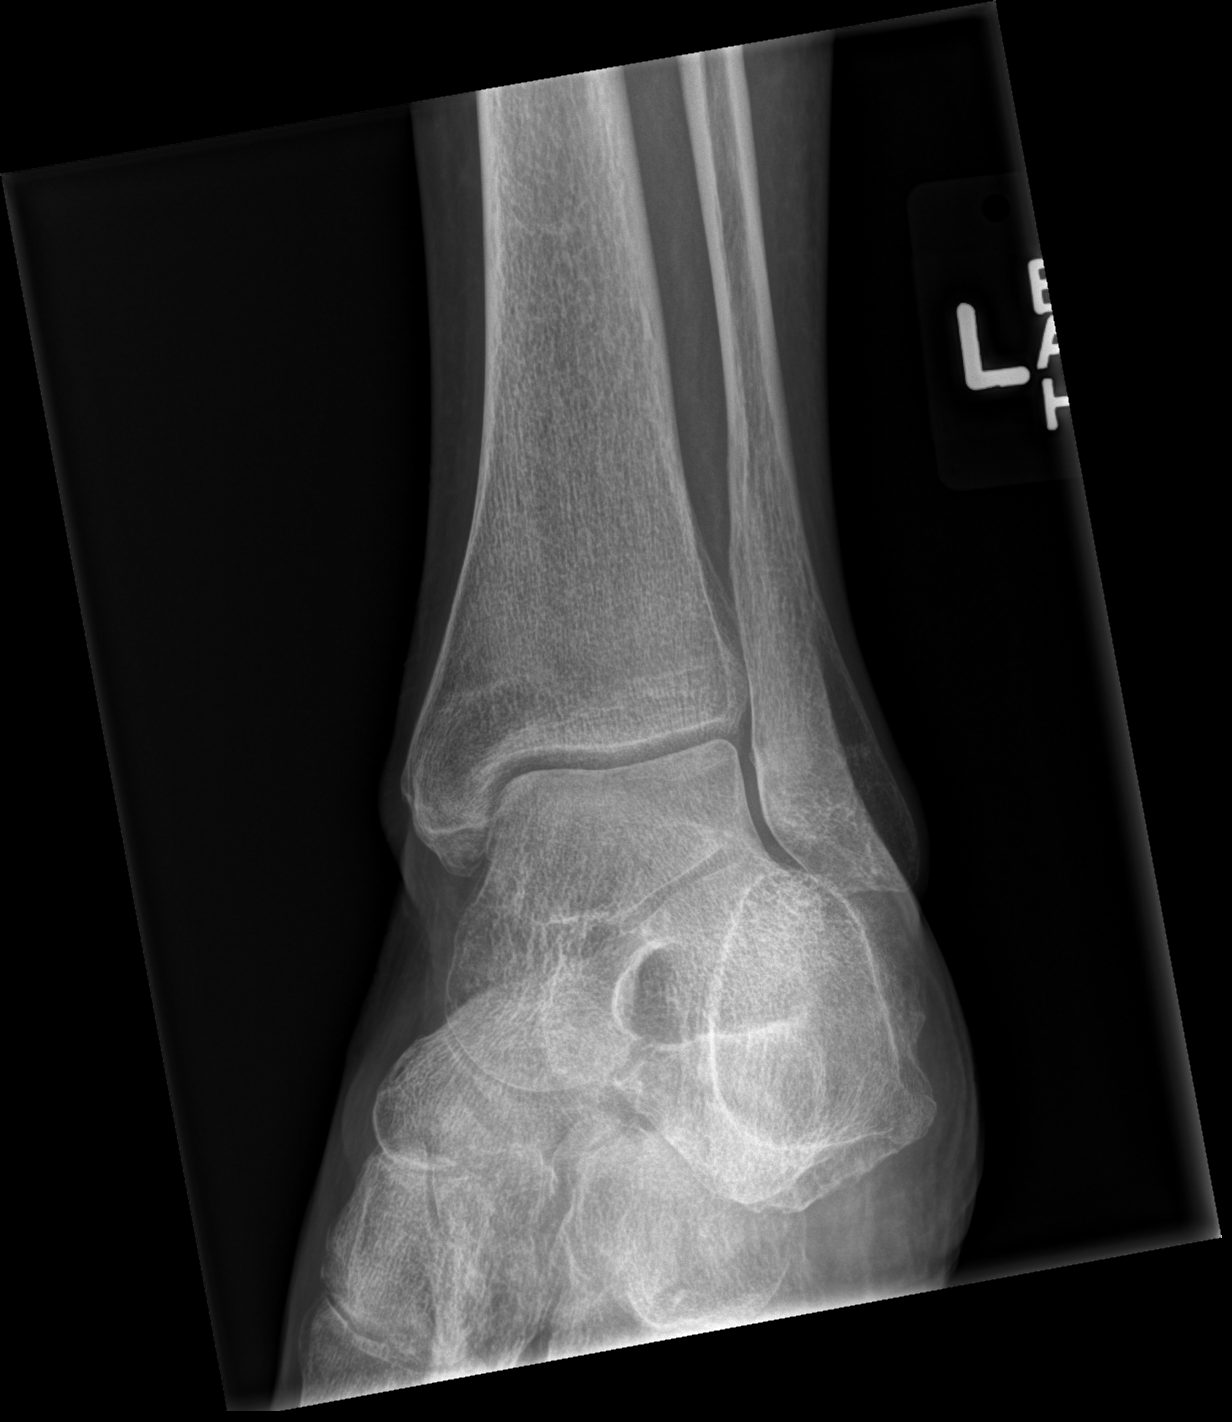

[x ankle lat left]
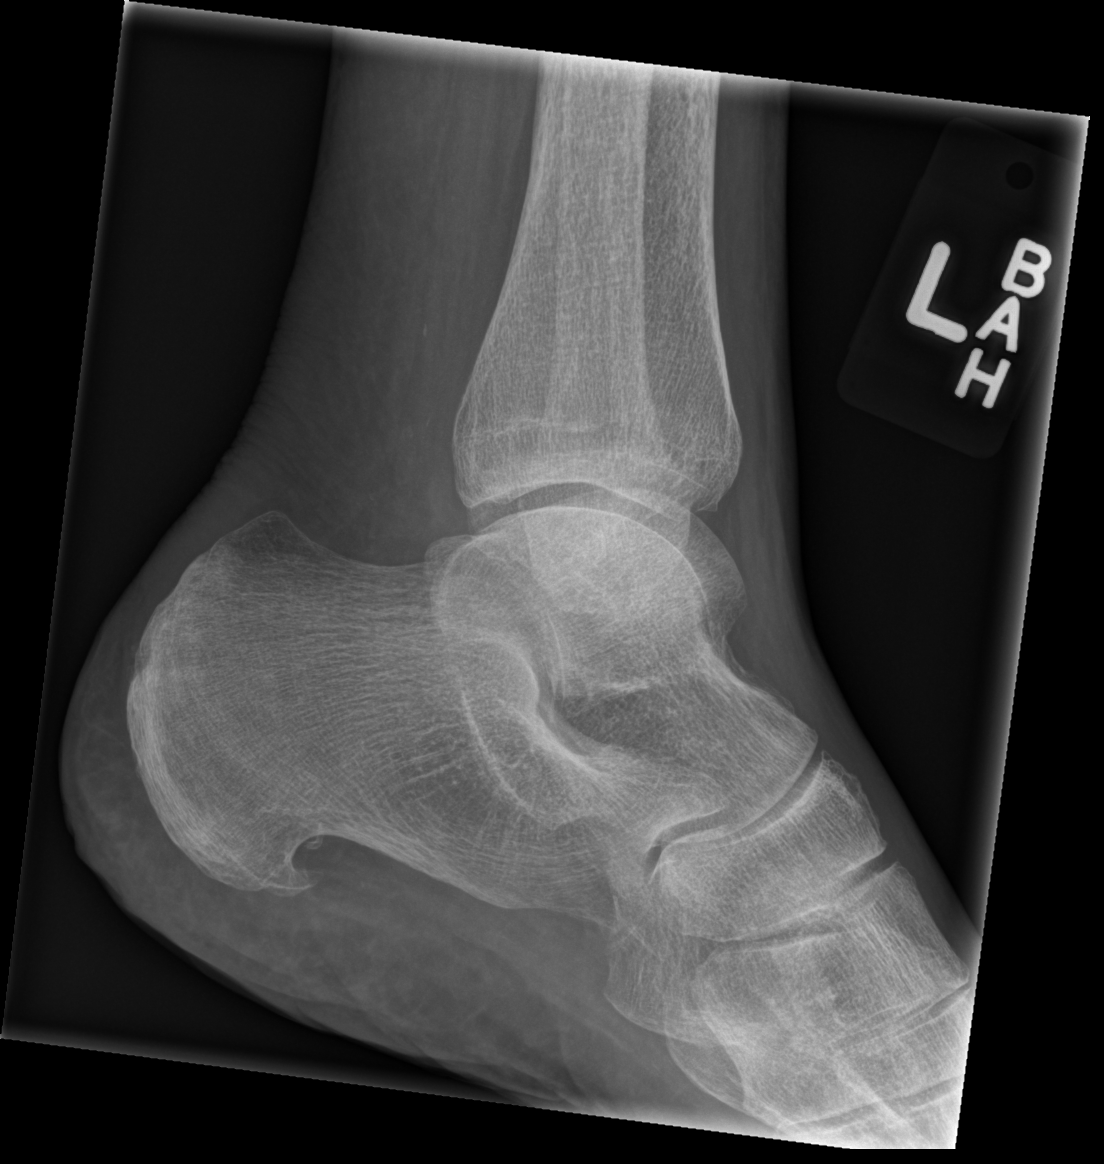

[3 of 3 positions shown; findings below may reference images not displayed]

FINDINGS: No acute bony or joint abnormality is identified. Small calcaneal
spur is noted. Soft tissue structures are unremarkable.
IMPRESSION: Negative exam.
